# Patient Record
Sex: Female | Born: 1955 | Race: White | Hispanic: No | Marital: Married | Smoking: Never smoker
Health system: Southern US, Community
[De-identification: ages and names within clinical notes are randomized; demographics above are authoritative.]

## PROBLEM LIST (undated history)

## (undated) DIAGNOSIS — F32A Depression, unspecified: Secondary | ICD-10-CM

## (undated) DIAGNOSIS — G588 Other specified mononeuropathies: Secondary | ICD-10-CM

## (undated) DIAGNOSIS — N9489 Other specified conditions associated with female genital organs and menstrual cycle: Secondary | ICD-10-CM

## (undated) DIAGNOSIS — M6289 Other specified disorders of muscle: Secondary | ICD-10-CM

## (undated) DIAGNOSIS — N814 Uterovaginal prolapse, unspecified: Secondary | ICD-10-CM

## (undated) DIAGNOSIS — G43909 Migraine, unspecified, not intractable, without status migrainosus: Secondary | ICD-10-CM

## (undated) DIAGNOSIS — F329 Major depressive disorder, single episode, unspecified: Secondary | ICD-10-CM

## (undated) DIAGNOSIS — K5790 Diverticulosis of intestine, part unspecified, without perforation or abscess without bleeding: Secondary | ICD-10-CM

## (undated) DIAGNOSIS — K219 Gastro-esophageal reflux disease without esophagitis: Secondary | ICD-10-CM

## (undated) DIAGNOSIS — N824 Other female intestinal-genital tract fistulae: Secondary | ICD-10-CM

## (undated) HISTORY — PX: HERNIA REPAIR: SHX51

## (undated) HISTORY — DX: Uterovaginal prolapse, unspecified: N81.4

## (undated) HISTORY — DX: Major depressive disorder, single episode, unspecified: F32.9

## (undated) HISTORY — PX: TONSILLECTOMY: SUR1361

## (undated) HISTORY — DX: Other specified conditions associated with female genital organs and menstrual cycle: N94.89

## (undated) HISTORY — DX: Other specified mononeuropathies: G58.8

## (undated) HISTORY — DX: Other specified disorders of muscle: M62.89

## (undated) HISTORY — DX: Other female intestinal-genital tract fistulae: N82.4

## (undated) HISTORY — DX: Migraine, unspecified, not intractable, without status migrainosus: G43.909

## (undated) HISTORY — PX: BACK SURGERY: SHX140

## (undated) HISTORY — DX: Depression, unspecified: F32.A

## (undated) HISTORY — PX: ANTERIOR AND POSTERIOR VAGINAL REPAIR: SUR5

## (undated) HISTORY — PX: APPENDECTOMY: SHX54

## (undated) HISTORY — PX: SIGMOID RESECTION / RECTOPEXY: SUR1294

## (undated) HISTORY — PX: OTHER SURGICAL HISTORY: SHX169

## (undated) HISTORY — DX: Diverticulosis of intestine, part unspecified, without perforation or abscess without bleeding: K57.90

## (undated) HISTORY — PX: TUBAL LIGATION: SHX77

## (undated) HISTORY — DX: Gastro-esophageal reflux disease without esophagitis: K21.9

---

## 1995-04-22 HISTORY — PX: BREAST ENHANCEMENT SURGERY: SHX7

## 1996-04-21 HISTORY — PX: ABDOMINAL HYSTERECTOMY: SHX81

## 1997-07-24 ENCOUNTER — Emergency Department (HOSPITAL_COMMUNITY): Admission: EM | Admit: 1997-07-24 | Discharge: 1997-07-24 | Payer: Self-pay | Admitting: Emergency Medicine

## 1998-03-01 ENCOUNTER — Inpatient Hospital Stay (HOSPITAL_COMMUNITY): Admission: RE | Admit: 1998-03-01 | Discharge: 1998-03-03 | Payer: Self-pay | Admitting: Obstetrics and Gynecology

## 1999-01-07 ENCOUNTER — Ambulatory Visit (HOSPITAL_COMMUNITY): Admission: RE | Admit: 1999-01-07 | Discharge: 1999-01-07 | Payer: Self-pay | Admitting: Internal Medicine

## 1999-03-09 ENCOUNTER — Emergency Department (HOSPITAL_COMMUNITY): Admission: EM | Admit: 1999-03-09 | Discharge: 1999-03-09 | Payer: Self-pay

## 1999-04-08 ENCOUNTER — Encounter: Payer: Self-pay | Admitting: Emergency Medicine

## 1999-04-08 ENCOUNTER — Emergency Department (HOSPITAL_COMMUNITY): Admission: EM | Admit: 1999-04-08 | Discharge: 1999-04-08 | Payer: Self-pay | Admitting: Emergency Medicine

## 1999-04-29 ENCOUNTER — Ambulatory Visit (HOSPITAL_COMMUNITY): Admission: RE | Admit: 1999-04-29 | Discharge: 1999-04-29 | Payer: Self-pay | Admitting: Obstetrics and Gynecology

## 1999-04-29 ENCOUNTER — Encounter: Payer: Self-pay | Admitting: Obstetrics and Gynecology

## 2001-12-14 ENCOUNTER — Other Ambulatory Visit: Admission: RE | Admit: 2001-12-14 | Discharge: 2001-12-14 | Payer: Self-pay | Admitting: Obstetrics and Gynecology

## 2002-07-05 ENCOUNTER — Encounter: Payer: Self-pay | Admitting: *Deleted

## 2002-07-05 ENCOUNTER — Ambulatory Visit (HOSPITAL_COMMUNITY): Admission: RE | Admit: 2002-07-05 | Discharge: 2002-07-05 | Payer: Self-pay | Admitting: *Deleted

## 2003-06-13 ENCOUNTER — Encounter: Admission: RE | Admit: 2003-06-13 | Discharge: 2003-06-13 | Payer: Self-pay | Admitting: Obstetrics and Gynecology

## 2004-01-17 ENCOUNTER — Ambulatory Visit (HOSPITAL_COMMUNITY): Admission: RE | Admit: 2004-01-17 | Discharge: 2004-01-17 | Payer: Self-pay | Admitting: *Deleted

## 2004-03-19 ENCOUNTER — Other Ambulatory Visit: Admission: RE | Admit: 2004-03-19 | Discharge: 2004-03-19 | Payer: Self-pay | Admitting: Obstetrics and Gynecology

## 2004-11-04 ENCOUNTER — Inpatient Hospital Stay (HOSPITAL_COMMUNITY): Admission: AD | Admit: 2004-11-04 | Discharge: 2004-11-04 | Payer: Self-pay | Admitting: Obstetrics and Gynecology

## 2005-01-16 ENCOUNTER — Other Ambulatory Visit: Admission: RE | Admit: 2005-01-16 | Discharge: 2005-01-16 | Payer: Self-pay | Admitting: Obstetrics and Gynecology

## 2005-02-06 ENCOUNTER — Ambulatory Visit: Payer: Self-pay | Admitting: Internal Medicine

## 2005-02-10 ENCOUNTER — Encounter: Payer: Self-pay | Admitting: Internal Medicine

## 2005-02-10 ENCOUNTER — Ambulatory Visit (HOSPITAL_COMMUNITY): Admission: RE | Admit: 2005-02-10 | Discharge: 2005-02-10 | Payer: Self-pay | Admitting: Internal Medicine

## 2005-02-13 ENCOUNTER — Ambulatory Visit: Payer: Self-pay | Admitting: Internal Medicine

## 2005-02-18 ENCOUNTER — Ambulatory Visit: Payer: Self-pay | Admitting: Internal Medicine

## 2005-04-04 ENCOUNTER — Inpatient Hospital Stay (HOSPITAL_COMMUNITY): Admission: RE | Admit: 2005-04-04 | Discharge: 2005-04-08 | Payer: Self-pay | Admitting: Surgery

## 2005-04-04 ENCOUNTER — Encounter (INDEPENDENT_AMBULATORY_CARE_PROVIDER_SITE_OTHER): Payer: Self-pay | Admitting: *Deleted

## 2005-04-04 ENCOUNTER — Encounter (INDEPENDENT_AMBULATORY_CARE_PROVIDER_SITE_OTHER): Payer: Self-pay | Admitting: Specialist

## 2005-04-08 ENCOUNTER — Encounter (INDEPENDENT_AMBULATORY_CARE_PROVIDER_SITE_OTHER): Payer: Self-pay | Admitting: *Deleted

## 2005-05-26 ENCOUNTER — Encounter (INDEPENDENT_AMBULATORY_CARE_PROVIDER_SITE_OTHER): Payer: Self-pay | Admitting: *Deleted

## 2005-05-26 ENCOUNTER — Encounter: Admission: RE | Admit: 2005-05-26 | Discharge: 2005-05-26 | Payer: Self-pay | Admitting: Surgery

## 2005-06-11 ENCOUNTER — Encounter (INDEPENDENT_AMBULATORY_CARE_PROVIDER_SITE_OTHER): Payer: Self-pay | Admitting: *Deleted

## 2005-06-11 ENCOUNTER — Ambulatory Visit (HOSPITAL_COMMUNITY): Admission: RE | Admit: 2005-06-11 | Discharge: 2005-06-11 | Payer: Self-pay | Admitting: Surgery

## 2005-06-19 ENCOUNTER — Ambulatory Visit: Payer: Self-pay | Admitting: Internal Medicine

## 2006-08-05 ENCOUNTER — Ambulatory Visit: Payer: Self-pay | Admitting: Internal Medicine

## 2007-02-23 ENCOUNTER — Ambulatory Visit: Payer: Self-pay | Admitting: Internal Medicine

## 2007-02-23 ENCOUNTER — Encounter: Admission: RE | Admit: 2007-02-23 | Discharge: 2007-02-23 | Payer: Self-pay | Admitting: Internal Medicine

## 2007-02-23 LAB — CONVERTED CEMR LAB
Basophils Relative: 1 % (ref 0.0–1.0)
HCT: 37.5 % (ref 36.0–46.0)
Hemoglobin: 13.2 g/dL (ref 12.0–15.0)
Lymphocytes Relative: 23.1 % (ref 12.0–46.0)
MCHC: 35.3 g/dL (ref 30.0–36.0)
MCV: 84.8 fL (ref 78.0–100.0)
Monocytes Absolute: 0.5 10*3/uL (ref 0.2–0.7)
Monocytes Relative: 9.9 % (ref 3.0–11.0)
Platelets: 174 10*3/uL (ref 150–400)
RBC: 4.42 M/uL (ref 3.87–5.11)
RDW: 12.1 % (ref 11.5–14.6)
WBC: 5.3 10*3/uL (ref 4.5–10.5)

## 2007-02-25 ENCOUNTER — Ambulatory Visit: Payer: Self-pay | Admitting: Internal Medicine

## 2007-02-25 ENCOUNTER — Encounter: Payer: Self-pay | Admitting: Internal Medicine

## 2007-03-02 ENCOUNTER — Encounter (INDEPENDENT_AMBULATORY_CARE_PROVIDER_SITE_OTHER): Payer: Self-pay | Admitting: *Deleted

## 2007-03-02 ENCOUNTER — Encounter: Admission: RE | Admit: 2007-03-02 | Discharge: 2007-03-02 | Payer: Self-pay | Admitting: Surgery

## 2007-04-29 ENCOUNTER — Ambulatory Visit: Payer: Self-pay | Admitting: Internal Medicine

## 2007-06-14 DIAGNOSIS — G43909 Migraine, unspecified, not intractable, without status migrainosus: Secondary | ICD-10-CM | POA: Insufficient documentation

## 2007-06-14 DIAGNOSIS — K219 Gastro-esophageal reflux disease without esophagitis: Secondary | ICD-10-CM | POA: Insufficient documentation

## 2007-09-07 ENCOUNTER — Telehealth: Payer: Self-pay | Admitting: Internal Medicine

## 2007-09-14 ENCOUNTER — Telehealth: Payer: Self-pay | Admitting: Internal Medicine

## 2007-09-17 ENCOUNTER — Telehealth: Payer: Self-pay | Admitting: Internal Medicine

## 2007-11-16 ENCOUNTER — Telehealth: Payer: Self-pay | Admitting: Internal Medicine

## 2007-11-22 ENCOUNTER — Ambulatory Visit: Payer: Self-pay | Admitting: Internal Medicine

## 2007-12-15 ENCOUNTER — Emergency Department (HOSPITAL_COMMUNITY): Admission: EM | Admit: 2007-12-15 | Discharge: 2007-12-15 | Payer: Self-pay | Admitting: Emergency Medicine

## 2007-12-15 IMAGING — CR DG SACRUM/COCCYX 2+V
3 series · 3 of 3 positions shown · non-contrast
Comparison: None

CLINICAL DATA: Fell off horse.

SACRUM AND COCCYX - 2+ VIEW

[t sacrum a.p.]
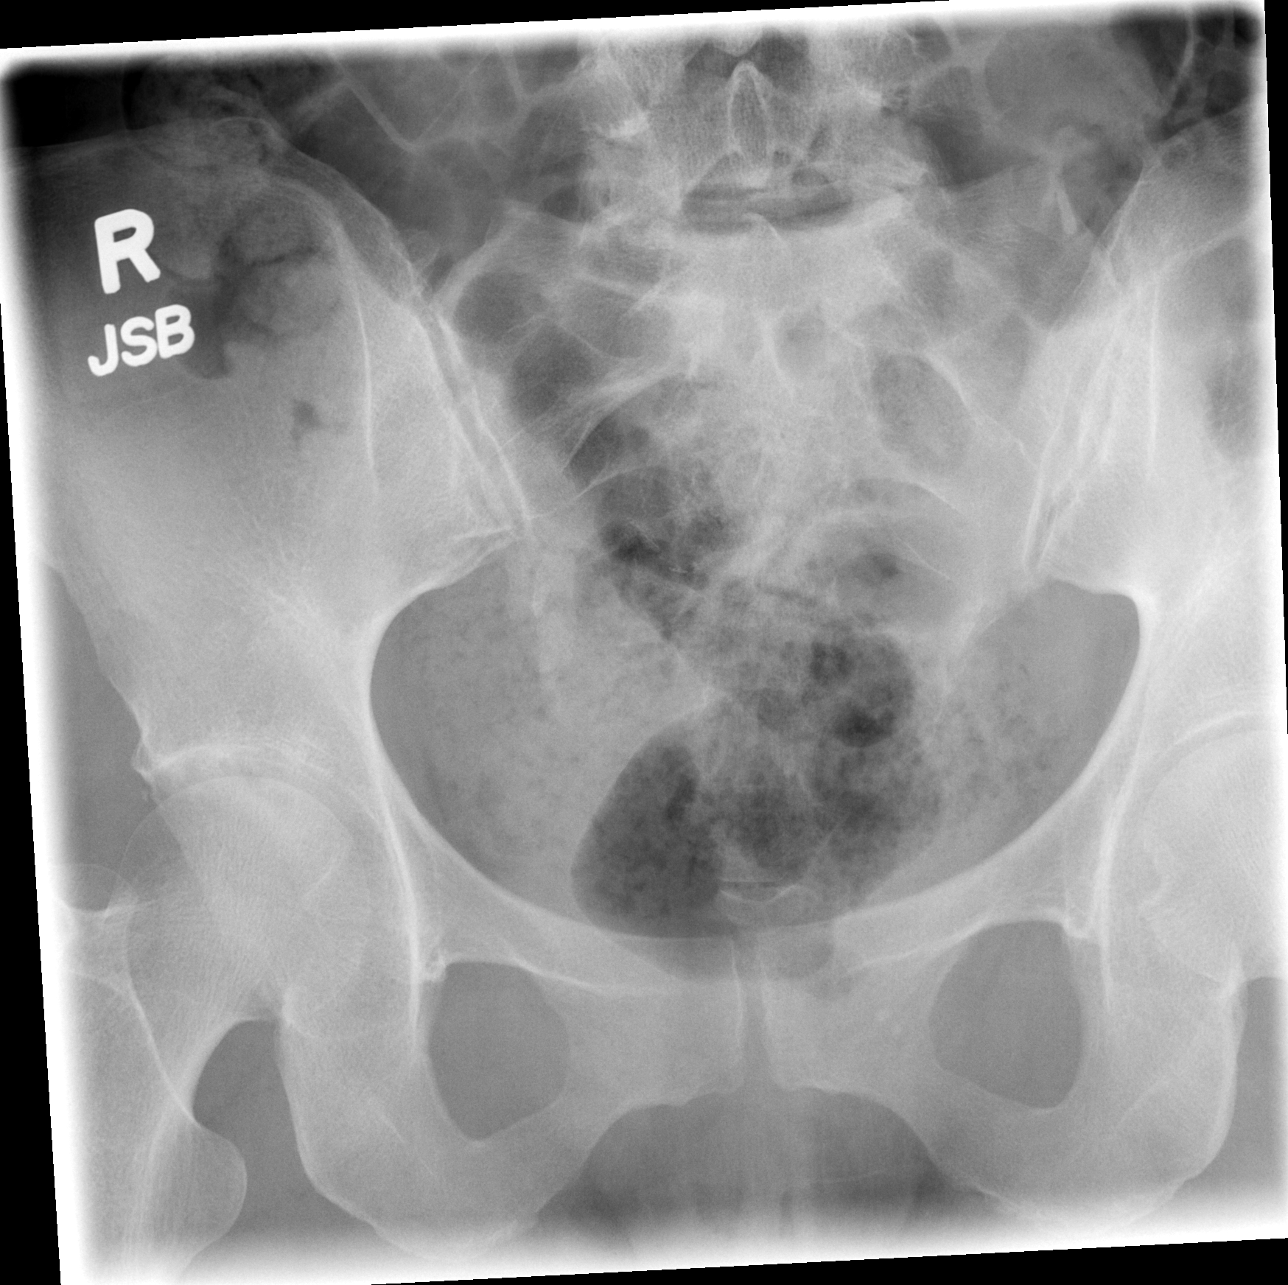

[t coccyx a.p.]
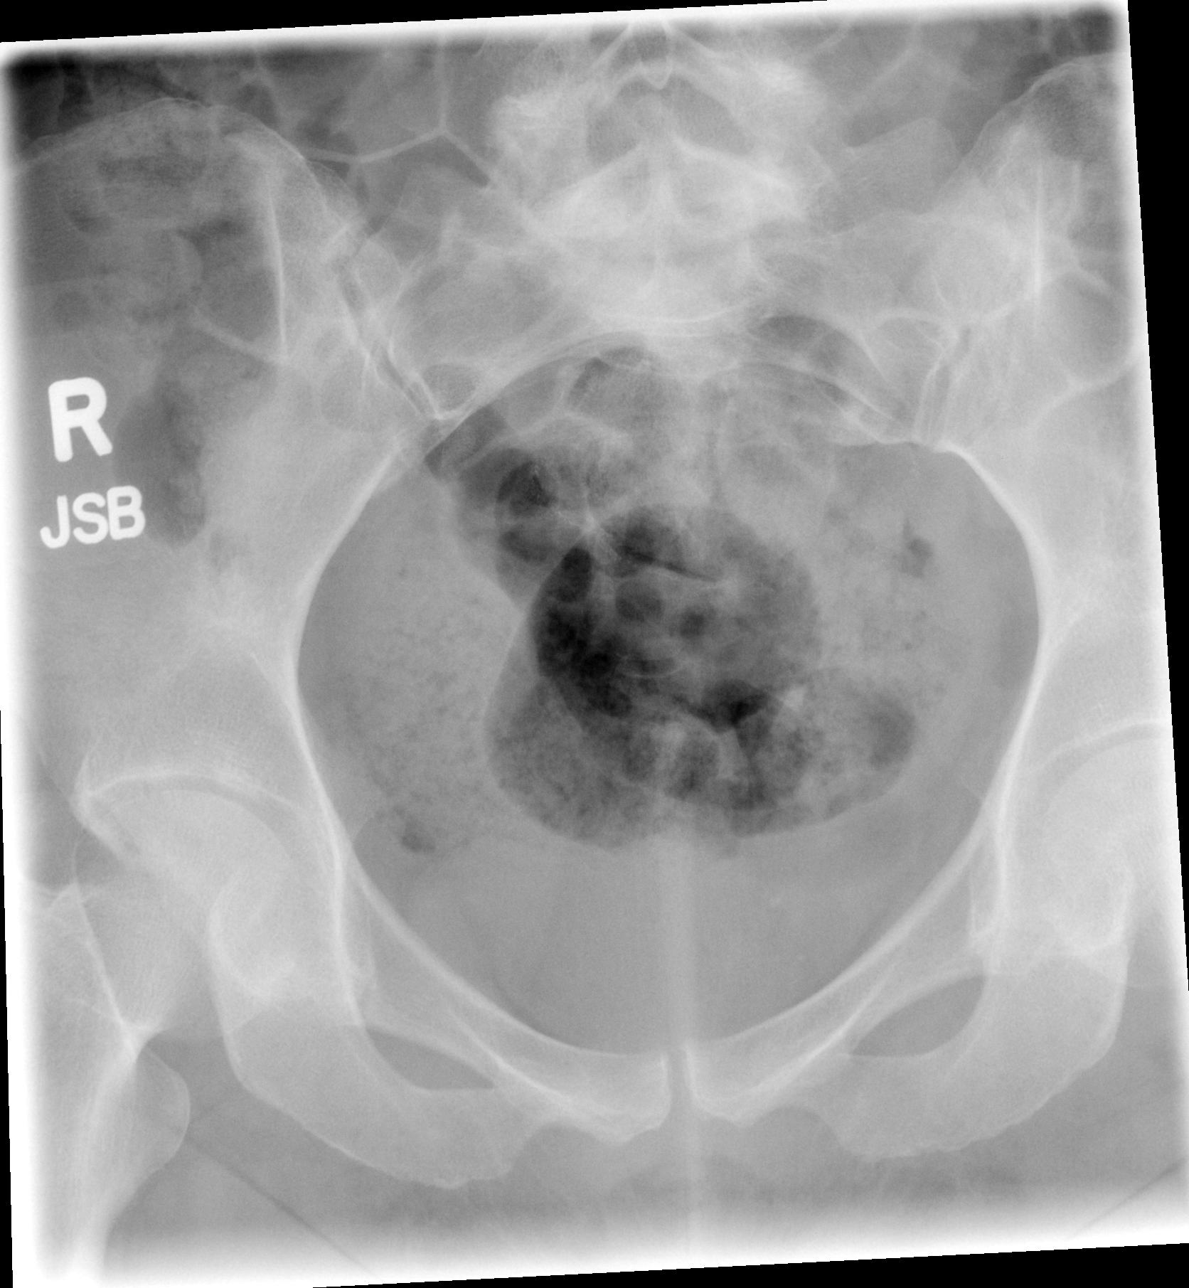

[t coccyx lat]
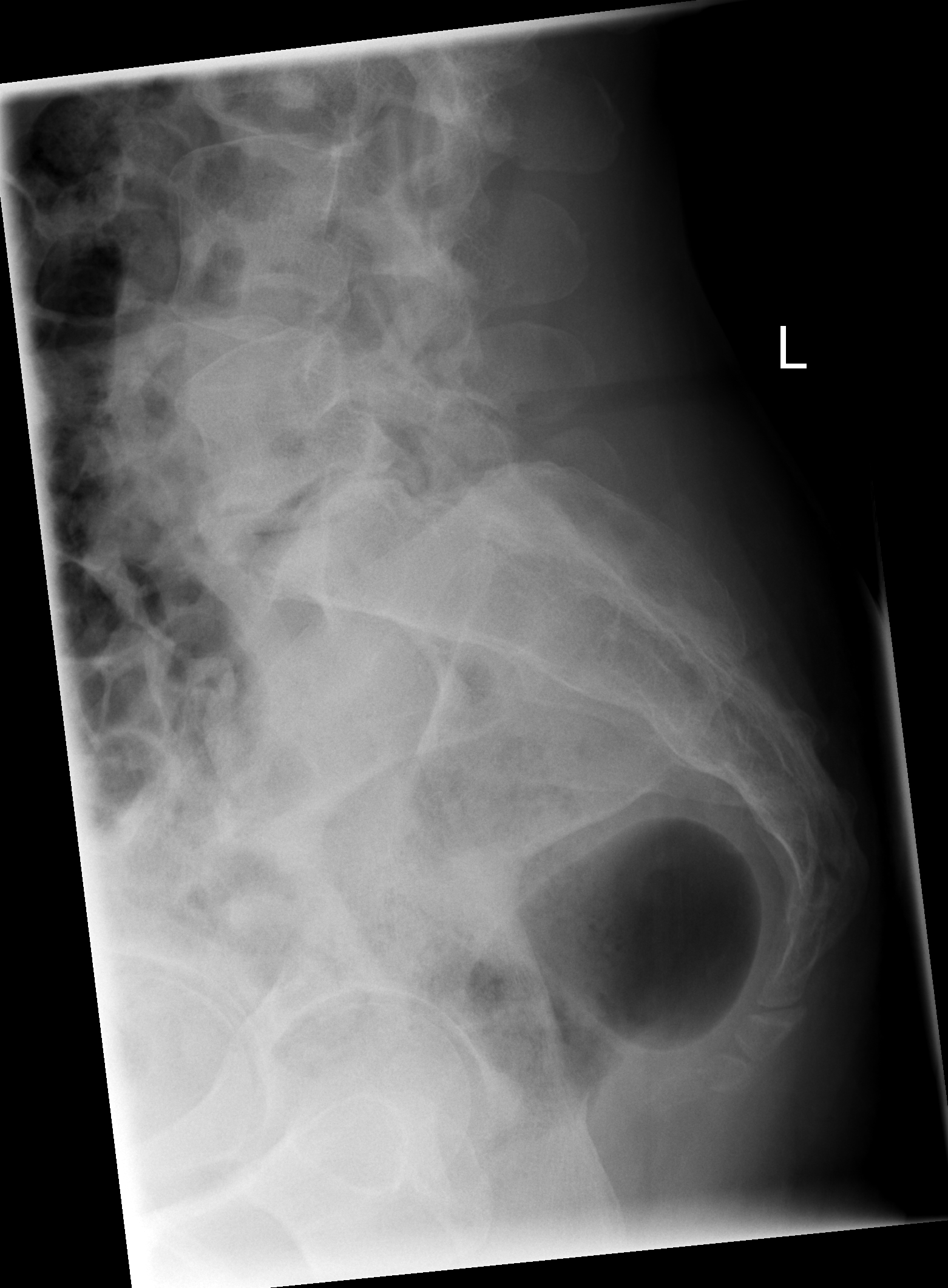

[3 of 3 positions shown; findings below may reference images not displayed]

FINDINGS: SI joints are symmetric.  No acute bony abnormality.  No
evidence of sacral or coccygeal fracture.
IMPRESSION: No acute bony abnormality.

## 2007-12-20 ENCOUNTER — Telehealth: Payer: Self-pay | Admitting: Internal Medicine

## 2007-12-21 ENCOUNTER — Telehealth: Payer: Self-pay | Admitting: Internal Medicine

## 2007-12-22 ENCOUNTER — Ambulatory Visit: Payer: Self-pay | Admitting: Family Medicine

## 2007-12-22 DIAGNOSIS — IMO0002 Reserved for concepts with insufficient information to code with codable children: Secondary | ICD-10-CM | POA: Insufficient documentation

## 2007-12-22 DIAGNOSIS — M533 Sacrococcygeal disorders, not elsewhere classified: Secondary | ICD-10-CM | POA: Insufficient documentation

## 2007-12-22 DIAGNOSIS — K623 Rectal prolapse: Secondary | ICD-10-CM | POA: Insufficient documentation

## 2007-12-23 ENCOUNTER — Telehealth: Payer: Self-pay | Admitting: Family Medicine

## 2007-12-23 ENCOUNTER — Encounter: Payer: Self-pay | Admitting: Family Medicine

## 2008-01-04 ENCOUNTER — Telehealth: Payer: Self-pay | Admitting: Internal Medicine

## 2008-01-18 ENCOUNTER — Ambulatory Visit: Payer: Self-pay | Admitting: Family Medicine

## 2008-01-20 LAB — CONVERTED CEMR LAB
ALT: 19 units/L (ref 0–35)
AST: 24 units/L (ref 0–37)
Albumin: 3.8 g/dL (ref 3.5–5.2)
Alkaline Phosphatase: 52 units/L (ref 39–117)
Bilirubin, Direct: 0.1 mg/dL (ref 0.0–0.3)
Cholesterol: 138 mg/dL (ref 0–200)
Creatinine, Ser: 0.7 mg/dL (ref 0.4–1.2)
GFR calc Af Amer: 113 mL/min
HDL: 43.5 mg/dL (ref >39.0)
Potassium: 4.5 meq/L (ref 3.5–5.1)
T3, Free: 3.1 pg/mL (ref 2.3–4.2)
TSH: 0.21 microintl units/mL — ABNORMAL LOW (ref 0.35–5.50)
Total Bilirubin: 0.7 mg/dL (ref 0.3–1.2)
Total Protein: 6.5 g/dL (ref 6.0–8.3)
Triglycerides: 68 mg/dL (ref 0–149)

## 2008-01-24 ENCOUNTER — Ambulatory Visit: Payer: Self-pay | Admitting: Family Medicine

## 2008-01-24 DIAGNOSIS — F438 Other reactions to severe stress: Secondary | ICD-10-CM | POA: Insufficient documentation

## 2008-01-24 DIAGNOSIS — N824 Other female intestinal-genital tract fistulae: Secondary | ICD-10-CM | POA: Insufficient documentation

## 2008-01-24 DIAGNOSIS — K5730 Diverticulosis of large intestine without perforation or abscess without bleeding: Secondary | ICD-10-CM | POA: Insufficient documentation

## 2008-01-28 ENCOUNTER — Telehealth: Payer: Self-pay | Admitting: Family Medicine

## 2008-03-01 ENCOUNTER — Telehealth: Payer: Self-pay | Admitting: Family Medicine

## 2008-06-29 ENCOUNTER — Telehealth: Payer: Self-pay | Admitting: Internal Medicine

## 2008-07-03 ENCOUNTER — Telehealth: Payer: Self-pay | Admitting: Family Medicine

## 2009-03-20 ENCOUNTER — Telehealth: Payer: Self-pay | Admitting: Internal Medicine

## 2009-05-28 ENCOUNTER — Telehealth: Payer: Self-pay | Admitting: Internal Medicine

## 2009-05-29 DIAGNOSIS — R5383 Other fatigue: Secondary | ICD-10-CM

## 2009-05-29 DIAGNOSIS — R5381 Other malaise: Secondary | ICD-10-CM | POA: Insufficient documentation

## 2009-05-30 ENCOUNTER — Telehealth: Payer: Self-pay | Admitting: Internal Medicine

## 2009-05-30 ENCOUNTER — Encounter: Payer: Self-pay | Admitting: Internal Medicine

## 2009-06-12 ENCOUNTER — Telehealth: Payer: Self-pay | Admitting: Internal Medicine

## 2009-06-21 ENCOUNTER — Telehealth: Payer: Self-pay | Admitting: Internal Medicine

## 2009-08-09 ENCOUNTER — Telehealth: Payer: Self-pay | Admitting: Internal Medicine

## 2010-05-11 ENCOUNTER — Encounter: Payer: Self-pay | Admitting: Internal Medicine

## 2010-05-12 ENCOUNTER — Encounter: Payer: Self-pay | Admitting: Internal Medicine

## 2010-05-21 NOTE — Op Note (Signed)
Summary: Cystoscopy (colovaginal fistula)  NAME:  Margaret Richardson, Margaret Richardson             ACCOUNT NO.:  [id number]   MEDICAL RECORD NO.:  [id number]          PATIENT TYPE:  INP   LOCATION:  1416                         FACILITY:  [facility]   PHYSICIAN:  Bertram Millard. Dahlstedt, M.D.DATE OF BIRTH:  December 14, 1955   DATE OF PROCEDURE:  DATE OF DISCHARGE:                                 OPERATIVE REPORT   PREOPERATIVE DIAGNOSIS:  Sigmoid diverticulitis with colovaginal fistula.   POSTOPERATIVE DIAGNOSIS:  Sigmoid diverticulitis with colovaginal fistula.   PROCEDURE:  Cystoscopy, bilateral ureteral catheter placement.   SURGEON:  Bertram Millard. Dahlstedt, M.D.   ANESTHESIA:  General endotracheal.   COMPLICATIONS:  None.   BRIEF HISTORY:  This nice 55 year old female presents for sigmoid colectomy  by Dr. Corliss Skains. She has a colorectal fistula from this diverticulitis.  Ureteral catheter placement is requested before the surgical procedure.   DESCRIPTION OF PROCEDURE:  This lady was given preoperative IV antibiotics  and taken to the operating room where general anesthetic was administered.  She was placed in the low dorsal lithotomy position. Genitalia and perineum  were prepped and draped. Cystoscopy was performed. The bladder appeared  normal. Both ureteral orifices were cannulated with a 6-French end-hole  catheter. They were passed proximally into the renal pelves bilaterally. The  bladder was drained, the scope removed and a Foley catheter placed. The  stents were placed to the catheter and hooked to a  Goldberg catheter  Adapter. The patient tolerated the procedure well.   Dr. Corliss Skains commenced with the open surgical part of the procedure at this  point.      Bertram Millard. Dahlstedt, M.D.  Electronically Signed     SMD/MEDQ  D:  04/04/2005  T:  04/07/2005  Job:  [id number]

## 2010-05-21 NOTE — Progress Notes (Signed)
Summary: Triage  Phone Note Call from Patient Call back at [phone]   Caller: Patient Call For: Dr. Juanda Chance Reason for Call: Talk to Nurse Summary of Call: Pt. said her lab orders need to be faxed to [facility] [phone]. Her Ins. will not pay for [facility]. Pt. also has some questions about the lab orders. Initial call taken by: Karna Christmas,  May 30, 2009 10:45 AM  Follow-up for Phone Call        DR.BRODIE--Pt. wants some labs added to her orders-Estrogen level and Cortisol level. is this O.K.? Follow-up by: Laureen Ochs LPN,  May 30, 2009 10:57 AM  Additional Follow-up for Phone Call Additional follow up Details #1::        It is OK Additional Follow-up by: Hart Carwin MD,  May 30, 2009 12:57 PM     Appended Document: Triage Lab orders faxed to above #, pt. aware.

## 2010-05-21 NOTE — Progress Notes (Signed)
Summary: Lab Results  Phone Note Call from Patient Call back at Home Phone [phone]   Call For: DR Shevaun Lovan Reason for Call: Lab or Test Results Initial call taken by: Leanor Kail [facility],  June 12, 2009 11:35 AM  Follow-up for Phone Call        Pt. is aware labs are WNL, no new orders from Dr.Kenya Shiraishi.  Pt. to keep scheduled office visit. Pt. instructed to call back as needed.   Follow-up by: Laureen Ochs LPN,  June 12, 2009 11:54 AM

## 2010-05-21 NOTE — Discharge Summary (Signed)
Summary: Colvaginal Fistula  NAME:  Margaret Richardson, Margaret Richardson             ACCOUNT NO.:  [id number]   MEDICAL RECORD NO.:  [id number]          PATIENT TYPE:  INP   LOCATION:  1416                         FACILITY:  [facility]   PHYSICIAN:  Wilmon Arms. Corliss Skains, M.D. 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   PROCEDURE PERFORMED:  Repair of colovaginal fistula with sigmoid colectomy  and primary anastomosis on April 04, 2005 by Dr. Corliss Skains.   BRIEF HISTORY:  The patient is a 55 year old female who presented with lower  abdominal pain and signs and symptoms of a colovaginal fistula. Apparently,  the patient had a bout of diverticulitis earlier this year. The patient has  had a previous hysterectomy and apparently the diverticulitis fistula is to  her vaginal cuff. The patient has been worked up prior to my evaluation by  Dr. Juanda Chance at gastroenterology, Dr. Henderson Cloud, GYN and Dr. Retta Diones in  urology. She was referred for a surgical consultation after discovery of the  colovaginal fistula.   HOSPITAL COURSE:  The patient underwent a 2 day bowel prep prior to surgery.  She was admitted to the hospital on April 04, 2005 where she underwent an  exploratory laparotomy, take down of the colovaginal fistula as well as a  sigmoid colectomy with primary anastomosis. The procedure went well. The  patient has progressed rather rapidly postoperatively. She regained bowel  function on postoperative day 2 and has been advanced to a regular diet.  Today she is being discharged home on a regular diet. She is taking Vicodin  p.r.n. for pain. Her wound appears to be healing well. There is one tiny  spot in the mid portion that was draining some fluid. I  removed 2 staples  and there was no purulence under this area. She will treat this with dry  gauze. Her pain is well controlled with the Vicodin.   DISCHARGE INSTRUCTIONS:  The patient is to followup on Friday at 12:40 p.m.  in my office for possible staple removal. She may eat a regular diet. She  has been given Vicodin p.r.n. for pain. I encouraged her to add a fiber  supplement to help firm up her bowel movements. She may ambulate and go up  and down stairs but she is to refrain from any strenuous activity.      Wilmon Arms. Tsuei, M.D.  Electronically Signed     MKT/MEDQ  D:  04/08/2005  T:  04/10/2005  Job:  [id number]   cc:   Lina Sar, M.D. [facility]  [address]  [place]  [place] [address]   Guy Sandifer. Henderson Cloud, M.D.  Fax: [phone]   Bertram Millard. Dahlstedt, M.D.  Fax: [phone]

## 2010-05-21 NOTE — Progress Notes (Signed)
Summary: Discuss her meds  Phone Note Call from Patient Call back at Home Phone [phone]   Call For: DR Juanda Chance Reason for Call: Talk to Nurse Summary of Call: Wants to discuss her meds with nurse before her appoinment on 07-06-09 Initial call taken by: Leanor Kail [facility],  May 28, 2009 4:20 PM  Follow-up for Phone Call        Patient called to state that she is extremely depressed and does not feel well. She called our office back in November 2010 to request something cheaper than Lexapro. She was advised at that time to find out what medication her insurance prefers and let us know so we can accomodate her request. She never called [place] back, but instead stopped taking her Lexapro. She states that she is very lethargic now and feels like she "needs a bunch of labs drawn soon." She denies any new physical symptoms or pain. She does tell me that she has been trying hospice counseling etc. She states that we told her we would help her with her Depression medications. Any suggestions? Follow-up by: Hortense Ramal CMA Duncan Dull),  May 28, 2009 5:25 PM  Additional Follow-up for Phone Call Additional follow up Details #1::        please start her on Elavil 25 mg by mouth at bedtime x 2 weeks then to 50 mg by mouth once daily, it is cheap. Draw CBC,C-Met, TSH, TIBC, Fe Additional Follow-up by: Hart Carwin MD,  May 28, 2009 6:09 PM  New Problems: FATIGUE (ICD-780.79)   Additional Follow-up for Phone Call Additional follow up Details #2::    Left message for patient to call back. Hortense Ramal CMA Duncan Dull)  May 29, 2009 9:15 AM   Patient advised that Dr Juanda Chance would like to start her on Elavil and would like her to have her labs drawn just to insure that there are no other physical problems causing her fatigue. Patient [facility]. She states that she will have to call her insurance to make sure that they will cover having her labs drawn at our facility.  Labs have been entered in IDX. Patient has been advised to call back immediately should she not start feeling better on the Elavil. Patient verbalizes understanding and agrees to do so. Hortense Ramal CMA Duncan Dull)  May 29, 2009 9:34 AM   New Problems: FATIGUE (ICD-780.79) New/Updated Medications: * ELAVIL 25 MG TABLET Take 1 tablet at bedtime x 2 weeks then increase to 2 tablets (50 mg) by mouth at bedtime Prescriptions: ELAVIL 25 MG TABLET Take 1 tablet at bedtime x 2 weeks then increase to 2 tablets (50 mg) by mouth at bedtime  #60 x 3   Entered by:   Hortense Ramal CMA (AAMA)   Authorized by:   Hart Carwin MD   Signed by:   Hortense Ramal CMA (AAMA) on 05/29/2009   Method used:   Faxed to ...       [facility] [address] (retail)       [address]       [place], [place]  [address]       Ph: [phone]       Fax: [phone]   RxID:   [id number]   Appended Document: Discuss her meds    Clinical Lists Changes  Medications: Rx of ELAVIL 25 MG TABLET Take 1 tablet at bedtime x  2 weeks then increase to 2 tablets (50 mg) by mouth at bedtime;  #60 x 3;  Signed;  Entered by: Hortense Ramal CMA (AAMA);  Authorized by: Hart Carwin MD;  Method used: Re-faxed to [facility] [address], [place], [place], [place]  [address], Ph: [phone], Fax: [phone]    Prescriptions: ELAVIL 25 MG TABLET Take 1 tablet at bedtime x 2 weeks then increase to 2 tablets (50 mg) by mouth at bedtime  #60 x 3   Entered by:   Hortense Ramal CMA (AAMA)   Authorized by:   Hart Carwin MD   Signed by:   Hortense Ramal CMA (AAMA) on 05/29/2009   Method used:   Re-Faxed to ...       [facility] [address] (retail)       [address]       [place], [place]  [address]       Ph: [phone]       Fax: [phone]   RxID:   [id number]

## 2010-05-21 NOTE — Op Note (Signed)
Summary: Flexible Sigmoidoscopy (Dr Corliss Skains)  NAME:  Margaret Richardson, Margaret Richardson             ACCOUNT NO.:  [id number]   MEDICAL RECORD NO.:  [id number]          PATIENT TYPE:  AMB   LOCATION:  ENDO                         FACILITY:  [facility]   PHYSICIAN:  Wilmon Arms. Corliss Skains, M.D. DATE OF BIRTH:  1955-06-01   DATE OF PROCEDURE:  06/11/2005  DATE OF DISCHARGE:                                 OPERATIVE REPORT   PREOP DIAGNOSIS:  Lower gastrointestinal bleed status post sigmoid  colectomy.   POSTOPERATIVE DIAGNOSIS:  Lower gastrointestinal bleed status post sigmoid  colectomy.   PROCEDURE PERFORMED:  Flexible sigmoidoscopy.   SURGEON:  Wilmon Arms. Tsuei, M.D.   ANESTHETIC:  Conscious sedation with 75 mcg of Fentanyl and 6 mg of Versed.   INDICATIONS FOR PROCEDURE:  The patient is a 55 year old female who recently  was diagnosed with a colovaginal fistula secondary to sigmoid  diverticulitis. She underwent a sigmoid resection approximately 2 months  ago. Since that time she has regained full bowel function, but has streaks  of blood with her stool; and she comes in today for a sigmoidoscopy to check  her anastomosis.   DESCRIPTION OF PROCEDURE:  The patient was seen in the endoscopy unit. After  obtaining informed consent, she was positioned on her left side. She was  given sedation. Digital rectal examination was not performed which showed no  masses. There was no gross blood. The scope was introduced and slowly  advanced to approximately 40 cm. The lower rectum and the distal sigmoid  colon that remains showed some mild irritation. The anastomosis was seen and  was widely patent. There was one area of granulation tissue posteriorly. The  scope was advanced through the anastomosis and advanced up to 40 cm. Some  small sigmoid diverticula were noted. These did not look inflamed and were  not bleeding. The prep was adequate. The scope was then slowly withdrawn. At  the anastomosis a hot biopsy forceps  was used to cauterize the area of  granulation tissue. This area was then irrigated. No further bleeding was  noted. The scope was then withdrawn. The patient tolerated the procedure  well. She was awakened and brought to the recovery room.      Wilmon Arms. Tsuei, M.D.  Electronically Signed     MKT/MEDQ  D:  06/11/2005  T:  06/12/2005  Job:  [id number]

## 2010-05-21 NOTE — Progress Notes (Signed)
Summary: Medication refill  Phone Note Call from Patient Call back at Home Phone [phone] Call back at [phone]   Caller: Patient Call For: Dr. Juanda Chance Reason for Call: Talk to Nurse Summary of Call: [facility] Initial call taken by: Karna Christmas,  August 09, 2009 11:57 AM  Follow-up for Phone Call        Dr Juanda Chance- This patient was getting ambien prescription from [place] and is now requesting new prescription...Marland Kitchenhowever, looks like she requested the prescription from her PCP, Dr Ermalene Searing and was told that Dr Ermalene Searing    New/Updated Medications: AMBIEN 10 MG  TABS (ZOLPIDEM TARTRATE) Take 1 tab by mouth at bedtime as needed. MUST HAVE OFFICE VISIT FOR FURTHER REFILLS! Prescriptions: AMBIEN 10 MG  TABS (ZOLPIDEM TARTRATE) Take 1 tab by mouth at bedtime as needed. MUST HAVE OFFICE VISIT FOR FURTHER REFILLS!  #30 x 0   Entered by:   Hortense Ramal CMA (AAMA)   Authorized by:   Hart Carwin MD   Signed by:   Hortense Ramal CMA (AAMA) on 08/09/2009   Method used:   Printed then faxed to ...       [facility] [address] (retail)       [address]       [place], [place]  [address]       Ph: [phone]       Fax: [phone]   RxID:   [id number]

## 2010-05-21 NOTE — Progress Notes (Signed)
Summary: Lab results  Phone Note Call from Patient Call back at Home Phone [phone]   Caller: Patient Call For: Dr. Juanda Chance Reason for Call: Lab or Test Results Summary of Call: Pt is calling regarding her lab results Initial call taken by: Karna Christmas,  June 21, 2009 12:18 PM  Follow-up for Phone Call        her insurance won't pay for the lab work that was  done at [facility] on 05-30-09.  her insurance denied it due to depression.  I advised her that the diagnosis code that was ordered was 780. 79  and that I didn't see depression on her problem list.  I have sent her a copy of the lab order that was placed in [facility] and a copy of her chart summary with her current meds and problems.  She will call [facility] to find out how they submitted this to her insurance companu. Follow-up by: Darcey Nora RN, CGRN,  June 21, 2009 2:13 PM

## 2010-09-03 NOTE — Assessment & Plan Note (Signed)
[facility]                         GASTROENTEROLOGY OFFICE NOTE   JIMMIE, DATTILIO                    MRN:          [id number]  DATE:02/23/2007                            DOB:          1955-07-03    Ms. Szafran is a 55 year old white female who is an acute work-in  because of lower abdominal pain which started yesterday after work.  It  was initially localized to the right lower quadrant.  She also has had  some low back pain last week or so.  We saw her in the past for acute  diverticulitis complicated by chronic colovaginal fistula which  ultimately required surgical resection by Dr. Corliss Skains in December, 2006.  She had a sigmoid colectomy.  She did very well until this summer when  her daughter was diagnosed with stage III breast cancer just 3 days  after delivering a baby.  Ms. Frumkin has been quite upset and visited  her daughter in [place] several times.  She has been having a hard time  dealing with her daughter's illness.  As a result of it she has  developed increasing gastroesophageal reflux, constipation, lower  abdominal pain.  She has lost about 7 pounds.  Ms. Landsberg has a history  of depression.  She used to be on Effexor up to 200 or 300 mg daily at  the time that her ex-husband was having bypass surgery, but she recently  has not been on an antidepressant.  She continues to work as a Engineer, civil (consulting) in  [employer].   MEDICATIONS:  1. Carafate 1 gram p.o. b.i.d.  2. Levsin sublingually 0.125 mg p.r.n.  3. Omeprazole over-the-counter 20 mg p.o. b.i.d.   PHYSICAL EXAM:  Blood pressure 108/70, pulse 60 and weight 121.8 pounds.  She alert, oriented, visibly upset.  She cried several times during our  interview.  LUNGS:  Clear to auscultation.  COR:  With normal S1, normal S2.  ABDOMEN:  Soft, relaxed but diffusely tender, more so in the left lower  and middle quadrant but no rebound, no fullness, no tympany.  No CVA  tenderness.  RECTAL EXAM:  Reveals normal rectal tone.  Stool was soft and Hemoccult  negative.   IMPRESSION:  72. A 55 year old white female with exacerbation of lower abdominal      pain and gastroesophageal reflux disease most likely related to      recent stress relating to this illness of her daughter.  She also      has a history of underlying diverticular disease of the colon and      likely irritable bowel syndrome.  She is status post sigmoid      colectomy for a colovaginal fistula two years ago.  There were no      signs of recurrent infections, no fever, mass effect or vaginal      discharge but patient is quite concerned about the possibility of      recurrent diverticulitis.  2. Gastroesophageal reflux most likely exacerbated by stress.   PLAN:  1. CT scan of the abdomen and pelvis to look for any inflammatory  changes in the pelvis related to her colon.  2. MiraLax 17 grams, to take 1/2 dose p.r.n. constipation.  3. Refill for Ambien 5 mg and Prilosec 20 mg b.i.d.  4. Vicodin 5/500, 1/2 tablet q.6 hours p.r.n. severe abdominal pain.  5. A new prescription for Levbid 0.375 mg p.o. b.i.d., dispense 60.  6. Depending on the results of the CT scan, consider flexible      sigmoidoscopy to look into the left colon.  At this point I do not      believe we are dealing with diverticulitis.  7. A CBC today.  8. I am also starting her on Lexapro 5 mg daily on a trial basis.     Hedwig Morton. Juanda Chance, MD  Electronically Signed    DMB/MedQ  DD: 02/23/2007  DT: 02/23/2007  Job #: [id number]

## 2010-09-03 NOTE — Assessment & Plan Note (Signed)
[facility]                         GASTROENTEROLOGY OFFICE NOTE   Margaret Richardson, TOBIA                    MRN:          [id number]  DATE:04/29/2007                            DOB:          1956/01/15    Ms. Berkery is a 55 year old white female with diverticulosis, status  post diverticulitis, colovaginal fistula, status post colon sigmoid  resection in December 2006 and flare-up of pelvic pain in November 2008.  She has been seen by Dr. Corliss Skains for possible recurrence of the  colovaginal fistula as well as by Dr. Henderson Cloud.  The CT scan did not show  any clear communication between the vagina and the colon.  This was also  confirmed on flexible sigmoidoscopy which was done on February 25, 2007  showing diverticulosis and functioning sigmoid anastomosis.  She has  done quite well in the last 2 months, finishing the course of Flagyl and  Cipro.  She has not worked and has avoided riding her horse which seemed  to have aggravated the pelvic pain.  She still has some dyspareunia and  some pain with prolonged standing.  She still has constipation but has  been able to reduce her pain medication tramadol from 50 mg twice a day  to 25 mg twice a day.  She also has reduced her Vicodin to only less  than once a week.   PHYSICAL EXAMINATION:  Blood pressure 108/68, pulse 88, weight 119.8  pounds.  She was alert, oriented, in no distress.  ABDOMEN:  Abdominal exam today showed soft abdomen which was mildly  diffusely tender but not distended with normoactive bowel sounds, tattoo  in the right lower quadrant, mild tenderness left lower quadrant.  RECTAL:  Exam not repeated.   IMPRESSION:  28. A 55 year old white female with resolving pelvic inflammation      without evidence of recurrent colovaginal fistula.  2. Known diverticulosis of the left colon, status post sigmoid      resection in December 2006.  3. Positive family history of esophageal cancer in her  father.  4. Gastroesophageal reflux disease.  5. Depression.   PLAN:  Renew of all her medications.  She needs to continue her MiraLax.  I advised her to try to gain about 2 more pounds to her usual weight of  over 120 pounds.  I will see her again in 3 months.     Hedwig Morton. Juanda Chance, MD  Electronically Signed    DMB/MedQ  DD: 04/29/2007  DT: 04/29/2007  Job #: [id number]   cc:   Guy Sandifer. Henderson Cloud, M.D.  Wilmon Arms. Tsuei, M.D.

## 2010-09-06 NOTE — Op Note (Signed)
NAMERONNE, SAVOIA             ACCOUNT NO.:  [id number]   MEDICAL RECORD NO.:  [id number]           PATIENT TYPE:   LOCATION:                                 FACILITY:   PHYSICIAN:  Wilmon Arms. Corliss Skains, M.D.      DATE OF BIRTH:   DATE OF PROCEDURE:  04/04/2005  DATE OF DISCHARGE:                                 OPERATIVE REPORT   PREOPERATIVE DIAGNOSIS:  Colovaginal fistula.   POSTOPERATIVE DIAGNOSIS:  Colovaginal fistula.   PROCEDURE PERFORMED:  1.  Repair of colovaginal fistula.  2.  Sigmoid colectomy.  3.  Appendectomy.   SURGEON:  Dr. Corliss Skains   ASSISTANT:  Dr. Lurene Shadow   ANESTHESIA:  General endotracheal.   INDICATION:  The patient is a 55 year old female in excellent health, who is  status post hysterectomy.  Earlier this year, she began having low  suprapubic abdominal pain.  Shortly thereafter, she began having abnormal  vaginal discharge and was passing gas from her vagina.  She was thoroughly  evaluated by Dr. Retta Diones in urology, who performed a CT scan as well as  cystoscopy.  This showed some inflammation in the posterior bladder.  She  also had a colonoscopy which showed severe diverticulosis with a narrowed  lumen in the mid sigmoid region.  CT scan confirmed the finding of  colovaginal fistula.  The patient is referred for surgical evaluation.  She  received a preoperative bowel preparation.   DESCRIPTION OF PROCEDURE:  The patient was brought to the operating room and  placed in a supine position on the operating room table.  After an adequate  level of general endotracheal anesthesia was obtained, the patient's legs  were placed in yellowfin stirrups in lithotomy position.  Dr. Retta Diones  performed cystoscopy with stent placement.  See the separate dictated  report.   After Dr. Retta Diones completed his portion of the procedure, the patient's  legs were lowered but remained in yellow fin stirrups.  Her abdomen was  prepped with Betadine and draped in a  sterile fashion.  A time-out was taken  to confirm the proper patient and proper procedure.  A lower midline  incision was made.  Dissection was carried down through the subcutaneous  tissues using Bovie cautery.  The fascia was opened, and the peritoneum was  entered.  There was no purulent fluid in the abdomen.  There were minimal  inflammatory adhesions in the left lower quadrant.  We were immediately able  to visualize the area of diverticulosis with chronic scarring.  This was  densely adherent to the vaginal cuff.  The patient was rotated into  Trendelenburg position.  Her small bowel was packed away with sponges.  The  Balfour retractor was used for lateral abdominal wall retraction.  The  adhesions holding the sigmoid colon to the lateral abdominal wall were taken  down along the white line of Toldt.  The colovaginal fistula was taken down  with blunt dissection.  The vaginal fistula opening was oversewn with a  figure-of-eight 2-0 silk suture.  The sigmoid colon at this point was fairly  mobile.  The  area of inflammation and thickening was relatively short,  approximately 12 cm.  The colon appeared normal proximally and distally.  The colon was then divided proximally and distally with GIA stapler.  The  mesentery was divided between [place] clamps and ligated with 2-0 silk ties.  Our anastomosis was then created by creating 2 small colotomies on the tinea  coli of each limb.  The GIA stapler was inserted into each opening and fired  to create a common channel.  The common opening was then closed with a TA60  stapler.  The mesenteric defect was closed with a 2-0 silk suture.  A couple  of bleeding areas at the edge of the staple line were oversewn with 3-0 silk  sutures.  The anastomosis was palpated and was felt to be widely patent.  We  then explored the remained of the abdomen.  There were no other  abnormalities noted.  The right colon appeared to be very mobile.  The  appendix  appeared long but normal in size and caliber.  We elected to do an  elective appendectomy.  The mesoappendix was ligated with 2-0 silk ties.  Another load of the GIA stapler was used to divide the base of the appendix.  This was re-placed down in the right lower quadrant.  Both fallopian tubes  and ovaries were visually inspected and noted to be normal.  The pelvis was  then thoroughly irrigated with saline.  No bleeding was noted.  All sponge  counts were correct at this point.  We pulled down the omentum and placed it  over the bowel in the lower abdomen.  The fascia was closed with a running  #1 PDS suture.  Skin staples were used to close the skin.  All sponge,  needle, and instrument counts were correct.      Wilmon Arms. Tsuei, M.D.  Electronically Signed     MKT/MEDQ  D:  04/04/2005  T:  04/07/2005  Job:  [id number]   cc:   Bertram Millard. Dahlstedt, M.D.  Fax: [phone]   Lina Sar, M.D. [facility]  [address]  [place]  [place] [address]

## 2010-09-06 NOTE — Assessment & Plan Note (Signed)
[facility]                         GASTROENTEROLOGY OFFICE NOTE   Margaret Richardson, Margaret Richardson                    MRN:          [id number]  DATE:08/05/2006                            DOB:          03-03-1956    Ms. Withington is a 55 year old white female who has new onset of  heartburn, indigestion, and chest pain which started in last 3 months.  We saw her last year with colovesical fistula, acute diverticulitis  necessitating sigmoid resection and repair of colovaginal fistula by Dr.  Corliss Skains on 04/04/2005.  She did well postoperatively and really has no  lower GI symptoms at this time.  There is no obvious reason for her  reflux symptoms.  She did change a job but her work schedule is regular,  from 7 a.m. to 3 p.m.  She has the same eating habits.  Her weight  remains stable and her stress level is somewhat less.  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  She does not smoke,  does not drink alcohol, and has only one cup of coffee a day.   MEDICATIONS:  1. Multivitamin and calcium supplement.  2. Nexium 40 mg, she has taken up to 4 a day from her mother's supply.   Approximately at [facility] Room with chest pain and was  admitted overnight to rule out MI.  Her cardiac CT scan, enzymes and EKG  were all normal and she was discharged to be followed by her internist.   PHYSICAL EXAMINATION:  Blood pressure 110/78, pulse 72, and weight 128  pounds.  She was in no distress.  NECK:  Supple, without adenopathy.  LUNG FIELDS:  Clear to auscultation.  CARDIAC:  Normal S1, S2.  ABDOMEN:  Soft, nontender.  I could not elicit any tenderness at  costochondral junction or of the sternum.  Her voice was normal.   IMPRESSION:  A 55 year old white female with new onset of what sounds  like either esophageal spasm, esophagitis, or gastroesophageal reflux.  She has a positive family history of esophageal cancer in her father who  had squamous cell carcinoma.  There is nothing to suggest esophageal  stricture and she has no preexisting history of gastroesophageal reflux.   PLAN:  1. Nexium 40 mg p.o. b.i.d..  2. Levsin sublingual .125 mg b.i.d. and p.r.n. esophageal spasm.  3. Carafate 1 gram twice a day.  4. Refill for Ambien 10 mg, dispense 30, 1 p.o. daily, transmitted      electronically to [facility].  5. I also wanted to do an upper endoscopy but the patient prefers to      wait and try medications first.     Hedwig Morton. Juanda Chance, MD  Electronically Signed    DMB/MedQ  DD: 08/05/2006  DT: 08/05/2006  Job #: [id number]   cc:   Corwin Levins, MD

## 2010-09-06 NOTE — Op Note (Signed)
Margaret Richardson, Margaret Richardson             ACCOUNT NO.:  [id number]   MEDICAL RECORD NO.:  [id number]          PATIENT TYPE:  AMB   LOCATION:  ENDO                         FACILITY:  [facility]   PHYSICIAN:  Wilmon Arms. Corliss Skains, M.D. DATE OF BIRTH:  1956-02-06   DATE OF PROCEDURE:  06/11/2005  DATE OF DISCHARGE:                                 OPERATIVE REPORT   PREOP DIAGNOSIS:  Lower gastrointestinal bleed status post sigmoid  colectomy.   POSTOPERATIVE DIAGNOSIS:  Lower gastrointestinal bleed status post sigmoid  colectomy.   PROCEDURE PERFORMED:  Flexible sigmoidoscopy.   SURGEON:  Wilmon Arms. Tsuei, M.D.   ANESTHETIC:  Conscious sedation with 75 mcg of Fentanyl and 6 mg of Versed.   INDICATIONS FOR PROCEDURE:  The patient is a 55 year old female who recently  was diagnosed with a colovaginal fistula secondary to sigmoid  diverticulitis. She underwent a sigmoid resection approximately 2 months  ago. Since that time she has regained full bowel function, but has streaks  of blood with her stool; and she comes in today for a sigmoidoscopy to check  her anastomosis.   DESCRIPTION OF PROCEDURE:  The patient was seen in the endoscopy unit. After  obtaining informed consent, she was positioned on her left side. She was  given sedation. Digital rectal examination was not performed which showed no  masses. There was no gross blood. The scope was introduced and slowly  advanced to approximately 40 cm. The lower rectum and the distal sigmoid  colon that remains showed some mild irritation. The anastomosis was seen and  was widely patent. There was one area of granulation tissue posteriorly. The  scope was advanced through the anastomosis and advanced up to 40 cm. Some  small sigmoid diverticula were noted. These did not look inflamed and were  not bleeding. The prep was adequate. The scope was then slowly withdrawn. At  the anastomosis a hot biopsy forceps was used to cauterize the area of  granulation tissue. This area was then irrigated. No further bleeding was  noted. The scope was then withdrawn. The patient tolerated the procedure  well. She was awakened and brought to the recovery room.      Wilmon Arms. Tsuei, M.D.  Electronically Signed     MKT/MEDQ  D:  06/11/2005  T:  06/12/2005  Job:  [id number]

## 2010-09-06 NOTE — Op Note (Signed)
NAMESTEPHANIEANN, [name]             ACCOUNT NO.:  [id number]   MEDICAL RECORD NO.:  [id number]          PATIENT TYPE:  INP   LOCATION:  1416                         FACILITY:  [facility]   PHYSICIAN:  Bertram Millard. Dahlstedt, M.D.DATE OF BIRTH:  February 08, 1956   DATE OF PROCEDURE:  DATE OF DISCHARGE:                                 OPERATIVE REPORT   PREOPERATIVE DIAGNOSIS:  Sigmoid diverticulitis with colovaginal fistula.   POSTOPERATIVE DIAGNOSIS:  Sigmoid diverticulitis with colovaginal fistula.   PROCEDURE:  Cystoscopy, bilateral ureteral catheter placement.   SURGEON:  Bertram Millard. Dahlstedt, M.D.   ANESTHESIA:  General endotracheal.   COMPLICATIONS:  None.   BRIEF HISTORY:  This nice 55 year old female presents for sigmoid colectomy  by Dr. Corliss Skains. She has a colorectal fistula from this diverticulitis.  Ureteral catheter placement is requested before the surgical procedure.   DESCRIPTION OF PROCEDURE:  This lady was given preoperative IV antibiotics  and taken to the operating room where general anesthetic was administered.  She was placed in the low dorsal lithotomy position. Genitalia and perineum  were prepped and draped. Cystoscopy was performed. The bladder appeared  normal. Both ureteral orifices were cannulated with a 6-French end-hole  catheter. They were passed proximally into the renal pelves bilaterally. The  bladder was drained, the scope removed and a Foley catheter placed. The  stents were placed to the catheter and hooked to a  Goldberg catheter  Adapter. The patient tolerated the procedure well.   Dr. Corliss Skains commenced with the open surgical part of the procedure at this  point.      Bertram Millard. Dahlstedt, M.D.  Electronically Signed     SMD/MEDQ  D:  04/04/2005  T:  04/07/2005  Job:  [id number]

## 2010-09-06 NOTE — Discharge Summary (Signed)
Margaret Richardson, REYBURN             ACCOUNT NO.:  [id number]   MEDICAL RECORD NO.:  [id number]          PATIENT TYPE:  INP   LOCATION:  1416                         [facility]ACILITY:  [facility]   PHYSICIAN:  Wilmon Arms. Corliss Skains, M.D. 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   PROCEDURE PERFORMED:  Repair of colovaginal fistula with sigmoid colectomy  and primary anastomosis on April 04, 2005 by Dr. Corliss Skains.   BRIEF HISTORY:  The patient is a 55 year old female who presented with lower  abdominal pain and signs and symptoms of a colovaginal fistula. Apparently,  the patient had a bout of diverticulitis earlier this year. The patient has  had a previous hysterectomy and apparently the diverticulitis fistula is to  her vaginal cuff. The patient has been worked up prior to my evaluation by  Dr. Juanda Chance at gastroenterology, Dr. Henderson Cloud, GYN and Dr. Retta Diones in  urology. She was referred for a surgical consultation after discovery of the  colovaginal fistula.   HOSPITAL COURSE:  The patient underwent a 2 day bowel prep prior to surgery.  She was admitted to the hospital on April 04, 2005 where she underwent an  exploratory laparotomy, take down of the colovaginal fistula as well as a  sigmoid colectomy with primary anastomosis. The procedure went well. The  patient has progressed rather rapidly postoperatively. She regained bowel  function on postoperative day 2 and has been advanced to a regular diet.  Today she is being discharged home on a regular diet. She is taking Vicodin  p.r.n. for pain. Her wound appears to be healing well. There is one tiny  spot in the mid portion that was draining some fluid. I removed 2 staples  and there  was no purulence under this area. She will treat this with dry  gauze. Her pain is well controlled with the Vicodin.   DISCHARGE INSTRUCTIONS:  The patient is to followup on Friday at 12:40 p.m.  in my office for possible staple removal. She may eat a regular diet. She  has been given Vicodin p.r.n. for pain. I encouraged her to add a fiber  supplement to help firm up her bowel movements. She may ambulate and go up  and down stairs but she is to refrain from any strenuous activity.      Wilmon Arms. Tsuei, M.D.  Electronically Signed     MKT/MEDQ  D:  04/08/2005  T:  04/10/2005  Job:  [id number]   cc:   Lina Sar, M.D. [facility]  [address]  [place]  [place] [address]   Guy Sandifer. Henderson Cloud, M.D.  Fax: [phone]   Bertram Millard. Dahlstedt, M.D.  Fax: [phone]

## 2011-10-13 ENCOUNTER — Telehealth: Payer: Self-pay | Admitting: Internal Medicine

## 2011-10-13 NOTE — Telephone Encounter (Signed)
If Aurea Graff gives me add-on spots in the office I can see her .Please talk to Lissa Hoard

## 2011-10-13 NOTE — Telephone Encounter (Signed)
Left a message for patient to call me. 

## 2011-10-13 NOTE — Telephone Encounter (Signed)
Patient calling to report rectal pain and bleeding x 1 month. She has tried OTC hemorrhoid suppositories. The bleeding is getting worse. States she has blood that drips in toilet and on tissue when wipes. Scheduled patient with Mike Gip, PA on 10/14/11 at 8:30 AM.

## 2011-10-13 NOTE — Telephone Encounter (Signed)
Rescheduled patient to 10/21/11 at 9:00 AM. Patient aware.

## 2011-10-14 ENCOUNTER — Ambulatory Visit: Payer: Self-pay | Admitting: Physician Assistant

## 2011-10-15 ENCOUNTER — Encounter: Payer: Self-pay | Admitting: *Deleted

## 2011-10-21 ENCOUNTER — Encounter: Payer: Self-pay | Admitting: Internal Medicine

## 2011-10-21 ENCOUNTER — Ambulatory Visit (INDEPENDENT_AMBULATORY_CARE_PROVIDER_SITE_OTHER): Payer: PRIVATE HEALTH INSURANCE | Admitting: Internal Medicine

## 2011-10-21 VITALS — BP 118/64 | HR 88 | Ht 63.0 in | Wt 147.0 lb

## 2011-10-21 DIAGNOSIS — K648 Other hemorrhoids: Secondary | ICD-10-CM

## 2011-10-21 DIAGNOSIS — K5732 Diverticulitis of large intestine without perforation or abscess without bleeding: Secondary | ICD-10-CM

## 2011-10-21 DIAGNOSIS — K6289 Other specified diseases of anus and rectum: Secondary | ICD-10-CM

## 2011-10-21 MED ORDER — HYDROCORTISONE ACETATE 25 MG RE SUPP: 25.0000 mg | Freq: Two times a day (BID) | RECTAL | Status: AC

## 2011-10-21 MED ORDER — LIDOCAINE (ANORECTAL) 5 % EX CREA: 1.0000 "application " | TOPICAL_CREAM | CUTANEOUS | Status: DC | PRN

## 2011-10-21 NOTE — Patient Instructions (Addendum)
We have sent the following medications to your pharmacy for you to pick up at your convenience: Anusol suppositories 2 times daily We have given you samples of the following medication to take: Recticare. You may apply this to your rectum as needed. If this works well, ask Engineer, production for it. It is over the counter. You have been scheduled for an appointment with Ruben Gottron, PT @ [facility] on Thursday 10/30/11 @ 10:00 am. They are located at [address] [employer]. If you need to reschedule your appointment, please contact [facility] directly at [phone]. CC: Dr Gaye Alken, Ruben Gottron, PT

## 2011-10-21 NOTE — Progress Notes (Signed)
Margaret Richardson May 08, 1955 MRN [id number]   History of Present Illness:  This is a 56 year old white female with chronic rectal pain which is on the inside of the rectum and sometimes radiates to her thigh area. She works as a Engineer, civil (consulting) in [employer], [employer]. We saw her for similar problems several years ago. She has a history of a vaginal hysterectomy and anterior and posterior repair by Dr.Tomblin in the past and then underwent a sigmoid resection for diverticular abscess and colovaginal fistula in November 2006 by Dr.Tsuei. Her last colonoscopy showed a patent anastomosis. A CT scan of the abdomen in November 2008 showed sigmoid thickening. She denies constipation but has occasional rectal bleeding. The pain is worse when she is standing and is partially relieved by laying down.    Past Medical History  Diagnosis Date  . Migraine   . Uterine prolapse   . Depression   . GERD (gastroesophageal reflux disease)   . Colovaginal fistula   . Diverticulosis    Past Surgical History  Procedure Date  . Sigmoid resection / rectopexy   . Colovaginal fistula repair   . Abdominal hysterectomy 1998  . Breast enhancement surgery 1997  . Appendectomy   . Hernia repair   . Tubal ligation   . Back surgery     lumbar disc    reports that she has never smoked. She has never used smokeless tobacco. She reports that she does not drink alcohol or use illicit drugs. family history includes Diabetes in her maternal grandmother.  There is no history of Colon cancer. No Known Allergies      Review of Systems:Negative for dyspepsia heartburn chest pain or shortness of breath  The remainder of the 10 point ROS is negative except as outlined in H&P   Physical Exam: General appearance  Well developed, in no distress. Eyes- non icteric. HEENT nontraumatic, normocephalic. Mouth no lesions, tongue papillated, no cheilosis. Neck supple without adenopathy, thyroid not enlarged, no carotid bruits, no  JVD. Lungs Clear to auscultation bilaterally. Cor normal S1, normal S2, regular rhythm, no murmur,  quiet precordium. Abdomen:Soft relaxed abdomen with normal active bowel sounds. No distention. No tenderness. Liver edge at costal margin.  Rectal:Normal perianal area. No apparent prolapse of the rectal tissue. Rectal sphincter tone is normal. On anoscopic exam, there is edematous nodular mucosa consistent with internal hemorrhoids versus discended rectal tissue which does not prolapse all the way through the anal canal. There is no rectal bleeding and there is no stool. Extremities no pedal edema. Skin no lesions. Neurological alert and oriented x 3. Psychological normal mood and affect.  Assessment and Plan:  Problem #1 Chronic rectal pain due to pelvic floor dysfunction and most likely a recurrent rectocele or possibly due to symptomatic internal hemorrhoids. We will have her evaluated in the incontinence clinic to see whether she would benefit from rectopexy. We will use Anusol-HC suppositories twice a day and Recticare to use as a topical anesthetic. I advised her to stay off her feet several times a day to take the pressure off the rectum. She would like to wait with doing another colonoscopy. We will consider a CT scan of the pelvis depending on the report from incontinence clinic. She has MiraLax at home to use when necessary.  Problem #2 History of diverticulitis with perforation and colovaginal fistula repair in November 2006.  Problem #3 Depression since the death of her daughter from breast cancer 3 years ago. Her grandson is with his father.  10/21/2011 Lina Sar

## 2012-07-06 ENCOUNTER — Telehealth: Payer: Self-pay | Admitting: Internal Medicine

## 2012-07-07 NOTE — Telephone Encounter (Signed)
Left a message for patient to call me. 

## 2012-07-07 NOTE — Telephone Encounter (Signed)
Spoke with patient and she has decided to see her GYN Dr. Henderson Cloud. She states she has an OV with him tomorrow. She requested I send him her lost OV note. Sent via [facility].

## 2012-09-08 ENCOUNTER — Encounter (HOSPITAL_COMMUNITY): Payer: Self-pay | Admitting: *Deleted

## 2012-09-08 ENCOUNTER — Inpatient Hospital Stay (HOSPITAL_COMMUNITY): Payer: PRIVATE HEALTH INSURANCE

## 2012-09-08 ENCOUNTER — Inpatient Hospital Stay (HOSPITAL_COMMUNITY)
Admission: AD | Admit: 2012-09-08 | Discharge: 2012-09-10 | DRG: 392 | Disposition: A | Discharge status: Home / Self Care | Payer: PRIVATE HEALTH INSURANCE | Source: Ambulatory Visit | Attending: Internal Medicine | Admitting: Internal Medicine

## 2012-09-08 DIAGNOSIS — R0789 Other chest pain: Secondary | ICD-10-CM

## 2012-09-08 DIAGNOSIS — F3289 Other specified depressive episodes: Secondary | ICD-10-CM | POA: Diagnosis present

## 2012-09-08 DIAGNOSIS — K219 Gastro-esophageal reflux disease without esophagitis: Principal | ICD-10-CM | POA: Diagnosis present

## 2012-09-08 DIAGNOSIS — F411 Generalized anxiety disorder: Secondary | ICD-10-CM | POA: Diagnosis present

## 2012-09-08 DIAGNOSIS — D649 Anemia, unspecified: Secondary | ICD-10-CM | POA: Diagnosis present

## 2012-09-08 DIAGNOSIS — R1013 Epigastric pain: Secondary | ICD-10-CM | POA: Diagnosis present

## 2012-09-08 DIAGNOSIS — R748 Abnormal levels of other serum enzymes: Secondary | ICD-10-CM | POA: Diagnosis present

## 2012-09-08 DIAGNOSIS — R079 Chest pain, unspecified: Secondary | ICD-10-CM | POA: Diagnosis present

## 2012-09-08 DIAGNOSIS — R0689 Other abnormalities of breathing: Secondary | ICD-10-CM

## 2012-09-08 DIAGNOSIS — K5900 Constipation, unspecified: Secondary | ICD-10-CM | POA: Diagnosis present

## 2012-09-08 DIAGNOSIS — Z79899 Other long term (current) drug therapy: Secondary | ICD-10-CM

## 2012-09-08 DIAGNOSIS — K859 Acute pancreatitis without necrosis or infection, unspecified: Secondary | ICD-10-CM

## 2012-09-08 DIAGNOSIS — F329 Major depressive disorder, single episode, unspecified: Secondary | ICD-10-CM | POA: Diagnosis present

## 2012-09-08 LAB — COMPREHENSIVE METABOLIC PANEL
ALT: 11 U/L (ref 0–35)
AST: 22 U/L (ref 0–37)
CO2: 26 mEq/L (ref 19–32)
Calcium: 9.6 mg/dL (ref 8.4–10.5)
Creatinine, Ser: 0.72 mg/dL (ref 0.50–1.10)
GFR calc non Af Amer: >90 mL/min (ref >90)
Sodium: 139 mEq/L (ref 135–145)
Total Bilirubin: 0.3 mg/dL (ref 0.3–1.2)
Total Protein: 7.4 g/dL (ref 6.0–8.3)

## 2012-09-08 LAB — LIPASE, BLOOD: Lipase: 67 U/L — ABNORMAL HIGH (ref 11–59)

## 2012-09-08 LAB — CBC WITH DIFFERENTIAL/PLATELET
Lymphocytes Relative: 37 % (ref 12–46)
Lymphs Abs: 2.1 10*3/uL (ref 0.7–4.0)
Neutro Abs: 2.8 10*3/uL (ref 1.7–7.7)
Neutrophils Relative %: 49 % (ref 43–77)
Platelets: 309 10*3/uL (ref 150–400)
RBC: 4.35 MIL/uL (ref 3.87–5.11)
WBC: 5.7 10*3/uL (ref 4.0–10.5)

## 2012-09-08 LAB — PRO B NATRIURETIC PEPTIDE: Pro B Natriuretic peptide (BNP): 23.3 pg/mL (ref 0–125)

## 2012-09-08 LAB — CREATININE, SERUM
Creatinine, Ser: 0.69 mg/dL (ref 0.50–1.10)
GFR calc non Af Amer: >90 mL/min (ref >90)

## 2012-09-08 LAB — TROPONIN I: Troponin I: <0.3 ng/mL (ref <0.30)

## 2012-09-08 MED ORDER — GI COCKTAIL ~~LOC~~
30.0000 mL | Freq: Once | ORAL | Status: AC
  Administered 2012-09-08: 30 mL via ORAL
  Filled 2012-09-08: qty 30

## 2012-09-08 MED ORDER — KETOROLAC TROMETHAMINE 30 MG/ML IJ SOLN
30.0000 mg | Freq: Once | INTRAMUSCULAR | Status: AC
  Administered 2012-09-08: 30 mg via INTRAVENOUS
  Filled 2012-09-08: qty 1

## 2012-09-08 MED ORDER — IOHEXOL 350 MG/ML SOLN
100.0000 mL | Freq: Once | INTRAVENOUS | Status: AC | PRN
  Administered 2012-09-08: 100 mL via INTRAVENOUS

## 2012-09-08 MED ORDER — LACTATED RINGERS IV SOLN
INTRAVENOUS | Status: DC
  Administered 2012-09-08: 16:00:00 via INTRAVENOUS
  Administered 2012-09-08: 1000 mL via INTRAVENOUS

## 2012-09-08 MED ORDER — HYDROMORPHONE HCL PF 1 MG/ML IJ SOLN
1.0000 mg | Freq: Once | INTRAMUSCULAR | Status: AC
  Administered 2012-09-08: 1 mg via INTRAVENOUS
  Filled 2012-09-08: qty 1

## 2012-09-08 MED ORDER — ONDANSETRON HCL 4 MG/2ML IJ SOLN
4.0000 mg | Freq: Once | INTRAMUSCULAR | Status: AC
  Administered 2012-09-08: 4 mg via INTRAVENOUS
  Filled 2012-09-08: qty 2

## 2012-09-08 MED ORDER — ASPIRIN 81 MG PO CHEW
324.0000 mg | CHEWABLE_TABLET | Freq: Once | ORAL | Status: AC
  Administered 2012-09-08: 324 mg via ORAL
  Filled 2012-09-08: qty 4

## 2012-09-08 NOTE — ED Provider Notes (Signed)
History     CSN: [id number]  Arrival date & time 09/08/12  1244   First MD Initiated Contact with Patient 09/08/12 1736      Chief Complaint  Patient presents with  . Chest Pain  . Shortness of Breath    (Consider location/radiation/quality/duration/timing/severity/associated sxs/prior treatment) Patient is a 57 y.o. female presenting with chest pain and shortness of breath. The history is provided by the patient.  Chest Pain Associated symptoms: shortness of breath   Shortness of Breath Associated symptoms: chest pain   She is 3 weeks status post repair of cystocele and rectocele and has been having chest pressure or and shortness of breath for the last 5 days. Symptoms have been constant. They are worse with exertion and not affected by body position. There is no associated nausea, vomiting, diaphoresis. Pain is not affected by eating. She went to [facility] hospital where she had a CT angiogram which was negative for pulmonary embolism. ECG done there was reported to show possible old anterior wall myocardial infarction and therefore she was sent here for further cardiac evaluation. Her symptoms are moderate to severe and she rates her pain at 8/10.  Past Medical History  Diagnosis Date  . Migraine   . Uterine prolapse   . Depression   . GERD (gastroesophageal reflux disease)   . Colovaginal fistula   . Diverticulosis     Past Surgical History  Procedure Laterality Date  . Sigmoid resection / rectopexy    . Colovaginal fistula repair    . Abdominal hysterectomy  1998  . Breast enhancement surgery  1997  . Appendectomy    . Hernia repair    . Tubal ligation    . Back surgery      lumbar disc  . Tonsillectomy    . Anterior and posterior vaginal repair      Family History  Problem Relation Age of Onset  . Colon cancer Neg Hx   . Diabetes Maternal Grandmother     History  Substance Use Topics  . Smoking status: Never Smoker   . Smokeless tobacco: Never Used  .  Alcohol Use: No    OB History   Grav Para Term Preterm Abortions TAB SAB Ect Mult Living   4 3 3  1 1    3       Review of Systems  Respiratory: Positive for shortness of breath.   Cardiovascular: Positive for chest pain.  All other systems reviewed and are negative.    Allergies  Codeine  Home Medications   Current Outpatient Rx  Name  Route  Sig  Dispense  Refill  . dibucaine (NUPERCAINAL) 1 % OINT   Rectal   Place 1 application rectally 3 (three) times daily as needed.         . docusate sodium (COLACE) 100 MG capsule   Oral   Take 200 mg by mouth daily.         . hyoscyamine (LEVSINEX) 0.375 MG 12 hr capsule   Oral   Take 0.375 mg by mouth as needed.         Marland Kitchen LORazepam (ATIVAN) 1 MG tablet   Oral   Take 1 mg by mouth as directed.         . Pantoprazole Sodium (PROTONIX PO)   Oral   Take 1 capsule by mouth daily.         . Polyethylene Glycol 3350 (MIRALAX PO)   Oral   Take by mouth as needed.         Marland Kitchen  sodium phosphate Pediatric (FLEET) 3.5-9.5 GM/59ML enema   Rectal   Place 1 enema rectally once.         . traMADol (ULTRAM) 50 MG tablet   Oral   Take 50 mg by mouth every 6 (six) hours as needed for pain.           BP 129/75  Pulse 83  Temp(Src) 98.3 F (36.8 C) (Oral)  Resp 20  Ht 5\' 2"  (1.575 m)  Wt 133 lb (60.328 kg)  BMI 24.32 kg/m2  SpO2 100%  Physical Exam  Nursing note and vitals reviewed.  57 year old female, resting comfortably and in no acute distress. Vital signs are normal. Oxygen saturation is 100%, which is normal. Head is normocephalic and atraumatic. PERRLA, EOMI. Oropharynx is clear. Neck is nontender and supple without adenopathy or JVD. Back is nontender and there is no CVA tenderness. Lungs are clear without rales, wheezes, or rhonchi. Chest is nontender. Heart has regular rate and rhythm without murmur. Abdomen is soft, flat, with moderate epigastric tenderness. There are no masses or  hepatosplenomegaly and peristalsis is normoactive. Lower abdomen is moderately tender but this is clearly related to her recent incision. Extremities have no cyanosis or edema, full range of motion is present. Skin is warm and dry without rash. Neurologic: Mental status is normal, cranial nerves are intact, there are no motor or sensory deficits.  ED Course  Procedures (including critical care time)  Results for orders placed during the hospital encounter of 09/08/12  CBC WITH DIFFERENTIAL      Result Value Range   WBC 5.7  4.0 - 10.5 K/uL   RBC 4.35  3.87 - 5.11 MIL/uL   Hemoglobin 11.9 (*) 12.0 - 15.0 g/dL   HCT 40.9 (*) 81.1 - 91.4 %   MCV 77.2 (*) 78.0 - 100.0 fL   MCH 27.4  26.0 - 34.0 pg   MCHC 35.4  30.0 - 36.0 g/dL   RDW 78.2  95.6 - 21.3 %   Platelets 309  150 - 400 K/uL   Neutrophils Relative % 49  43 - 77 %   Neutro Abs 2.8  1.7 - 7.7 K/uL   Lymphocytes Relative 37  12 - 46 %   Lymphs Abs 2.1  0.7 - 4.0 K/uL   Monocytes Relative 13 (*) 3 - 12 %   Monocytes Absolute 0.7  0.1 - 1.0 K/uL   Eosinophils Relative 1  0 - 5 %   Eosinophils Absolute 0.1  0.0 - 0.7 K/uL   Basophils Relative 1  0 - 1 %   Basophils Absolute 0.1  0.0 - 0.1 K/uL  CREATININE, SERUM      Result Value Range   Creatinine, Ser 0.69  0.50 - 1.10 mg/dL   GFR calc non Af Amer >90  >90 mL/min   GFR calc Af Amer >90  >90 mL/min  PRO B NATRIURETIC PEPTIDE      Result Value Range   Pro B Natriuretic peptide (BNP) 23.3  0 - 125 pg/mL  TROPONIN I      Result Value Range   Troponin I <0.30  <0.30 ng/mL  COMPREHENSIVE METABOLIC PANEL      Result Value Range   Sodium 139  135 - 145 mEq/L   Potassium 3.9  3.5 - 5.1 mEq/L   Chloride 101  96 - 112 mEq/L   CO2 26  19 - 32 mEq/L   Glucose, Bld 101 (*) 70 - 99 mg/dL  BUN 15  6 - 23 mg/dL   Creatinine, Ser 1.61  0.50 - 1.10 mg/dL   Calcium 9.6  8.4 - 09.6 mg/dL   Total Protein 7.4  6.0 - 8.3 g/dL   Albumin 3.8  3.5 - 5.2 g/dL   AST 22  0 - 37 U/L   ALT 11   0 - 35 U/L   Alkaline Phosphatase 72  39 - 117 U/L   Total Bilirubin 0.3  0.3 - 1.2 mg/dL   GFR calc non Af Amer >90  >90 mL/min   GFR calc Af Amer >90  >90 mL/min  LIPASE, BLOOD      Result Value Range   Lipase 67 (*) 11 - 59 U/L   Ct Angio Chest W/cm &/or Wo Cm  09/08/2012   *RADIOLOGY REPORT*  Clinical Data: Increasing shortness of breath and chest pressure.  CT ANGIOGRAPHY CHEST  Technique:  Multidetector CT imaging of the chest using the standard protocol during bolus administration of intravenous contrast. Multiplanar reconstructed images including MIPs were obtained and reviewed to evaluate the vascular anatomy.  Contrast: OMNIPAQUE IOHEXOL 350 MG/ML SOLN  Comparison: None.  Findings: There are no pulmonary emboli.  No infiltrates or effusions or mass lesions.  Benign intrapulmonary node along the minor fissure in the right lung.  Heart size and vascularity are normal.  The lungs are somewhat hyperinflated with slight peribronchial thickening which can be seen with bronchitis.  Slight bilateral apical pleural thickening which is chronic.  No osseous abnormality.  IMPRESSION: Slight peribronchial thickening suggesting mild bronchitis.  No pulmonary emboli.   Original Report Authenticated By: Francene Boyers, M.D.     ECG shows normal sinus rhythm with a rate of 81, no ectopy. Normal axis. Normal P wave. Normal QRS. Normal intervals. Normal ST and T waves. Impression: normal ECG. No prior ECG available for comparison.   1. Chest tightness or pressure   2. Difficulty breathing   3. Pancreatitis       MDM  Chest discomfort which is very unlikely to be cardiac given the fact that it has been constant for 5 days and there no ECG changes. Troponin level will be checked. The only clue on her exam is epigastric tenderness and I wonder if this may be related to GERD or gastritis. She will be given a therapeutic trial of a GI cocktail. Also, liver enzymes will be checked.  She got is  slight and momentary relief of pain with GI cocktail. Similarly we'll cover with ketorolac, there was slight relief of pain which was temporary. Workup has come back positive for pancreatitis with lipase of 67. Causes of pancreatitis is not clear. Since she has not been able to control pain at home, it was decided to admit her to the hospital. She has also been having problems with constipation related to narcotic use. Case is discussed with Dr. Toniann Fail [facility] who agrees to admit the patient.      Dione Booze, MD 09/08/12 [phone]

## 2012-09-08 NOTE — ED Notes (Addendum)
Pt transferred from [facility]  to [facility] room B16. Per ems- pt c/o chest pain, is here for consult with cardiology. Pt states she had SOB on Sunday, and CP started today. EKG changes per womens MD. No changes seen with ems. Pt currently denies cp. Still complaining of mild SOB. BP-130/80 HR-80, NSR. O2-98% on RA, RR-18, shallow

## 2012-09-08 NOTE — MAU Note (Signed)
Pt states had anterior/posterior vaginal repair at [facility]. This Sunday began experiencing SOB, has gradually worsened. Base of R lung bs diminished, otherwise wnl.

## 2012-09-08 NOTE — ED Notes (Signed)
Pt c/o dizziness, cp, sob. Pt wob is increased. Pt states pain is in right shoulder, also central, and sometimes in left arm.

## 2012-09-08 NOTE — MAU Note (Signed)
Surgery April 29th.

## 2012-09-08 NOTE — ED Notes (Signed)
Pt requesting Toradol for pain,  Dr. Preston Fleeting made aware of same

## 2012-09-08 NOTE — MAU Provider Note (Signed)
History     CSN: [id number]  Arrival date and time: 09/08/12 1244   None     Chief Complaint  Patient presents with  . Post-op Problem   HPI Margaret Richardson is 57 y.o. presents with shortness of breath, difficulty taking deep breaths X 3 days.  States her right shoulder discomfort and chest tenderness. She denies fever but has had chills.  She is a patient of Dr. Huel Coventry.  She had anterior/posterior repair on 08/17/12 at [facility].  Explains the surgery took longer than anticipated and that since the surgery she has been having pain.   She is taking tramadol for pain.   Past Medical History  Diagnosis Date  . Migraine   . Uterine prolapse   . Depression   . GERD (gastroesophageal reflux disease)   . Colovaginal fistula   . Diverticulosis     Past Surgical History  Procedure Laterality Date  . Sigmoid resection / rectopexy    . Colovaginal fistula repair    . Abdominal hysterectomy  1998  . Breast enhancement surgery  1997  . Appendectomy    . Hernia repair    . Tubal ligation    . Back surgery      lumbar disc    Family History  Problem Relation Age of Onset  . Colon cancer Neg Hx   . Diabetes Maternal Grandmother     History  Substance Use Topics  . Smoking status: Never Smoker   . Smokeless tobacco: Never Used  . Alcohol Use: No    Allergies:  Allergies  Allergen Reactions  . Codeine Nausea Only    Pt states she can take with promethazine    Prescriptions prior to admission  Medication Sig Dispense Refill  . dibucaine (NUPERCAINAL) 1 % OINT Place 1 application rectally 3 (three) times daily as needed.      . docusate sodium (COLACE) 100 MG capsule Take 200 mg by mouth daily.      . hyoscyamine (LEVSINEX) 0.375 MG 12 hr capsule Take 0.375 mg by mouth as needed.      Marland Kitchen LORazepam (ATIVAN) 1 MG tablet Take 1 mg by mouth as directed.      . Pantoprazole Sodium (PROTONIX PO) Take 1 capsule by mouth daily.      . Polyethylene Glycol 3350 (MIRALAX PO) Take by  mouth as needed.      . sodium phosphate Pediatric (FLEET) 3.5-9.5 GM/59ML enema Place 1 enema rectally once.      . traMADol (ULTRAM) 50 MG tablet Take 50 mg by mouth every 6 (six) hours as needed for pain.        Review of Systems  Constitutional: Positive for chills. Negative for fever.  Respiratory: Positive for shortness of breath. Negative for cough and hemoptysis.        Difficulty taking deep breaths.  Gastrointestinal: Negative for abdominal pain.   Physical Exam   Blood pressure 126/67, pulse 86, temperature 97.8 F (36.6 C), temperature source Oral, resp. rate 22, height 5\' 2"  (1.575 m), weight 133 lb (60.328 kg), SpO2 100.00%.  Physical Exam  Constitutional: She is oriented to person, place, and time. She appears well-developed and well-nourished. No distress.  HENT:  Head: Normocephalic.  Cardiovascular: Normal rate and normal heart sounds.   Respiratory: Effort normal and breath sounds normal. No respiratory distress. She has no wheezes. She has no rales.  Neurological: She is alert and oriented to person, place, and time.  Skin: Skin is warm and  dry.  Psychiatric: She has a normal mood and affect. Her behavior is normal.   Results for orders placed during the hospital encounter of 09/08/12 (from the past 24 hour(s))  CBC WITH DIFFERENTIAL     Status: Abnormal   Collection Time    09/08/12  2:35 PM      Result Value Range   WBC 5.7  4.0 - 10.5 K/uL   RBC 4.35  3.87 - 5.11 MIL/uL   Hemoglobin 11.9 (*) 12.0 - 15.0 g/dL   HCT 47.8 (*) 29.5 - 62.1 %   MCV 77.2 (*) 78.0 - 100.0 fL   MCH 27.4  26.0 - 34.0 pg   MCHC 35.4  30.0 - 36.0 g/dL   RDW 30.8  65.7 - 84.6 %   Platelets 309  150 - 400 K/uL   Neutrophils Relative % 49  43 - 77 %   Neutro Abs 2.8  1.7 - 7.7 K/uL   Lymphocytes Relative 37  12 - 46 %   Lymphs Abs 2.1  0.7 - 4.0 K/uL   Monocytes Relative 13 (*) 3 - 12 %   Monocytes Absolute 0.7  0.1 - 1.0 K/uL   Eosinophils Relative 1  0 - 5 %   Eosinophils  Absolute 0.1  0.0 - 0.7 K/uL   Basophils Relative 1  0 - 1 %   Basophils Absolute 0.1  0.0 - 0.1 K/uL  CREATININE, SERUM     Status: None   Collection Time    09/08/12  2:35 PM      Result Value Range   Creatinine, Ser 0.69  0.50 - 1.10 mg/dL   GFR calc non Af Amer >90  >90 mL/min   GFR calc Af Amer >90  >90 mL/min    EKG-Normal sinus rhythm with sinus arrhythmia; possible anterior infarct, age undetermined.  Abnormal EKG       *RADIOLOGY REPORT*  Clinical Data: Increasing shortness of breath and chest pressure.  CT ANGIOGRAPHY CHEST  Technique: Multidetector CT imaging of the chest using the  standard protocol during bolus administration of intravenous  contrast. Multiplanar reconstructed images including MIPs were  obtained and reviewed to evaluate the vascular anatomy.  Contrast: OMNIPAQUE IOHEXOL 350 MG/ML SOLN  Comparison: None.  Findings: There are no pulmonary emboli. No infiltrates or  effusions or mass lesions. Benign intrapulmonary node along the  minor fissure in the right lung.  Heart size and vascularity are normal.  The lungs are somewhat hyperinflated with slight peribronchial  thickening which can be seen with bronchitis. Slight bilateral  apical pleural thickening which is chronic. No osseous  abnormality.  IMPRESSION:  Slight peribronchial thickening suggesting mild bronchitis. No  pulmonary emboli.  Original Report Authenticated By: Francene Boyers, M.D.     MAU Course  Procedures  MDM 13:50  Reported MSE, patient's recent surgical history and PHI to Dr. Marcelle Overlie.  He states he will talk to Dr. Henderson Cloud and call back with orders.  14:23  Phone call from Dr. Marcelle Overlie with orders for CBC/diff, EKG and CT angiogram of the chest to R/O pulmonary embolus 15:20 Consulted with Dr Blima Dessert re: EKG results.  His interpretation was there were T wave abnormalities seen in anterior leads.  EKG for comparison would be helpful but not available.  He states this  is not STEMI, no need for cath lab.  Since age of abnormality unknown, enzymes may be helpful.   15:40 Reported labs, EKG,consult with Dr. Herma Carson and CT results to Dr.  [place].  She would like further evaluation at [facility] for EKG results and chest sxs.  He would like the cardiologist to decide if enzymes needed  Reported patients request for pain medication.  She last took Tramadol at 10am.  She is requesting no narcotic because she wants to avoid constipation with her bowel "issues".  Order given by Dr. Marcelle Overlie for Toradol 30mg  IV.  She is also nauseated, will give Zofran 4mg  IV. 16:00 Consulted with Dr. Preston Fleeting regarding need for further evaluation.  He accepted care. PLAN OF CARE discussed with patient.  Will transfer by [facility] Assessment and Plan  A:  Difficulty breathing       Chest discomfort      Pulmonary embolus ruled out     3 week post operative--anterior and posterior repairs  P:  Transfer to [facility] for further evaluation.  KEY,EVE M 09/08/2012, 1:29 PM

## 2012-09-09 ENCOUNTER — Inpatient Hospital Stay (HOSPITAL_COMMUNITY): Payer: PRIVATE HEALTH INSURANCE

## 2012-09-09 DIAGNOSIS — K859 Acute pancreatitis without necrosis or infection, unspecified: Secondary | ICD-10-CM

## 2012-09-09 DIAGNOSIS — R1013 Epigastric pain: Secondary | ICD-10-CM

## 2012-09-09 DIAGNOSIS — R079 Chest pain, unspecified: Secondary | ICD-10-CM

## 2012-09-09 DIAGNOSIS — R748 Abnormal levels of other serum enzymes: Secondary | ICD-10-CM

## 2012-09-09 LAB — COMPREHENSIVE METABOLIC PANEL
Albumin: 3.5 g/dL (ref 3.5–5.2)
Alkaline Phosphatase: 67 U/L (ref 39–117)
BUN: 17 mg/dL (ref 6–23)
Chloride: 100 mEq/L (ref 96–112)
GFR calc Af Amer: >90 mL/min (ref >90)
Glucose, Bld: 96 mg/dL (ref 70–99)
Potassium: 3.9 mEq/L (ref 3.5–5.1)
Total Bilirubin: 0.3 mg/dL (ref 0.3–1.2)

## 2012-09-09 LAB — CBC WITH DIFFERENTIAL/PLATELET
Basophils Absolute: 0 10*3/uL (ref 0.0–0.1)
Basophils Relative: 0 % (ref 0–1)
Eosinophils Relative: 1 % (ref 0–5)
Lymphocytes Relative: 25 % (ref 12–46)
MCHC: 35.1 g/dL (ref 30.0–36.0)
MCV: 76 fL — ABNORMAL LOW (ref 78.0–100.0)
Platelets: 282 10*3/uL (ref 150–400)
RDW: 13.5 % (ref 11.5–15.5)
WBC: 7 10*3/uL (ref 4.0–10.5)

## 2012-09-09 LAB — LIPASE, BLOOD: Lipase: 47 U/L (ref 11–59)

## 2012-09-09 MED ORDER — HYOSCYAMINE SULFATE CR 0.375 MG PO CP12: 0.3750 mg | ORAL_CAPSULE | ORAL | Status: DC | PRN

## 2012-09-09 MED ORDER — SODIUM CHLORIDE 0.9 % IJ SOLN
3.0000 mL | Freq: Two times a day (BID) | INTRAMUSCULAR | Status: DC
  Administered 2012-09-10: 3 mL via INTRAVENOUS

## 2012-09-09 MED ORDER — HYOSCYAMINE SULFATE ER 0.375 MG PO TB12
0.3750 mg | ORAL_TABLET | Freq: Two times a day (BID) | ORAL | Status: DC | PRN
  Administered 2012-09-09: 0.375 mg via ORAL
  Filled 2012-09-09 (×5): qty 1

## 2012-09-09 MED ORDER — ONDANSETRON HCL 4 MG PO TABS
4.0000 mg | ORAL_TABLET | Freq: Four times a day (QID) | ORAL | Status: DC | PRN
  Administered 2012-09-10: 4 mg via ORAL
  Filled 2012-09-09: qty 1

## 2012-09-09 MED ORDER — POLYETHYLENE GLYCOL 3350 17 G PO PACK
17.0000 g | PACK | Freq: Two times a day (BID) | ORAL | Status: DC
  Administered 2012-09-09 – 2012-09-10 (×3): 17 g via ORAL
  Filled 2012-09-09 (×4): qty 1

## 2012-09-09 MED ORDER — ONDANSETRON HCL 4 MG/2ML IJ SOLN
4.0000 mg | Freq: Four times a day (QID) | INTRAMUSCULAR | Status: DC | PRN
  Administered 2012-09-09: 4 mg via INTRAVENOUS
  Filled 2012-09-09: qty 2

## 2012-09-09 MED ORDER — DOCUSATE SODIUM 100 MG PO CAPS
200.0000 mg | ORAL_CAPSULE | Freq: Two times a day (BID) | ORAL | Status: DC
  Administered 2012-09-09 – 2012-09-10 (×2): 200 mg via ORAL
  Filled 2012-09-09 (×3): qty 2

## 2012-09-09 MED ORDER — TRAMADOL HCL 50 MG PO TABS: 50.0000 mg | ORAL_TABLET | Freq: Four times a day (QID) | ORAL | Status: DC | PRN

## 2012-09-09 MED ORDER — ACETAMINOPHEN 650 MG RE SUPP: 650.0000 mg | Freq: Four times a day (QID) | RECTAL | Status: DC | PRN

## 2012-09-09 MED ORDER — HYDROMORPHONE HCL PF 1 MG/ML IJ SOLN
1.0000 mg | INTRAMUSCULAR | Status: DC | PRN
  Administered 2012-09-09: 1 mg via INTRAVENOUS
  Filled 2012-09-09: qty 1

## 2012-09-09 MED ORDER — DIBUCAINE 1 % RE OINT: 1.0000 "application " | TOPICAL_OINTMENT | Freq: Three times a day (TID) | RECTAL | Status: DC | PRN

## 2012-09-09 MED ORDER — LORAZEPAM 1 MG PO TABS
1.0000 mg | ORAL_TABLET | Freq: Every day | ORAL | Status: DC
  Administered 2012-09-09 (×2): 1 mg via ORAL
  Filled 2012-09-09 (×2): qty 1

## 2012-09-09 MED ORDER — TRAMADOL HCL 50 MG PO TABS
50.0000 mg | ORAL_TABLET | Freq: Four times a day (QID) | ORAL | Status: DC | PRN
  Administered 2012-09-09 – 2012-09-10 (×4): 50 mg via ORAL
  Filled 2012-09-09 (×4): qty 1

## 2012-09-09 MED ORDER — BISACODYL 10 MG RE SUPP
10.0000 mg | Freq: Every day | RECTAL | Status: DC | PRN
  Administered 2012-09-09: 10 mg via RECTAL
  Filled 2012-09-09: qty 1

## 2012-09-09 MED ORDER — IOHEXOL 300 MG/ML  SOLN
25.0000 mL | INTRAMUSCULAR | Status: AC
  Administered 2012-09-09: 25 mL via ORAL

## 2012-09-09 MED ORDER — DOCUSATE SODIUM 100 MG PO CAPS
200.0000 mg | ORAL_CAPSULE | Freq: Every day | ORAL | Status: DC
  Administered 2012-09-09: 200 mg via ORAL
  Filled 2012-09-09: qty 2

## 2012-09-09 MED ORDER — ENOXAPARIN SODIUM 40 MG/0.4ML ~~LOC~~ SOLN
40.0000 mg | Freq: Every day | SUBCUTANEOUS | Status: DC
  Filled 2012-09-09 (×2): qty 0.4

## 2012-09-09 MED ORDER — SODIUM CHLORIDE 0.9 % IV SOLN
INTRAVENOUS | Status: DC
  Administered 2012-09-09 (×2): via INTRAVENOUS

## 2012-09-09 MED ORDER — ACETAMINOPHEN 325 MG PO TABS
650.0000 mg | ORAL_TABLET | Freq: Four times a day (QID) | ORAL | Status: DC | PRN
  Administered 2012-09-10: 650 mg via ORAL
  Filled 2012-09-09: qty 2

## 2012-09-09 MED ORDER — HYDROCODONE-ACETAMINOPHEN 5-325 MG PO TABS
1.0000 | ORAL_TABLET | Freq: Four times a day (QID) | ORAL | Status: DC | PRN
  Filled 2012-09-09: qty 1

## 2012-09-09 NOTE — Progress Notes (Signed)
Pt refusing to have troponin drawn at this time; will cont. To monitor.

## 2012-09-09 NOTE — H&P (Addendum)
[facility] History and Physical  PAIDEN CARAVEO ZOX:[id number] DOB: 22-Nov-1955 DOA: 09/08/2012  Referring physician: ER physician. PCP: Gaye Alken, NP   Chief Complaint: Chest pain and epigastric pain.  HPI: Margaret Richardson is a 57 y.o. female who has had recent rectocele and cystocele surgery at [facility] started experiencing chest pain and epigastric discomfort yesterday. Patient was told to go to to women's health by patient's OB/GYN. At [facility] health patient had CT angiogram of the chest which was negative for PE. Since patient's EKG was concerning patient was transferred to [facility] cone. EKG in the ER shows only normal sinus rhythm cardiac enzymes are negative lipase is mildly elevated. On exam patient does have significant abdominal tenderness at this time has been admitted for further management. Patient chest pain is pressure-like nonradiating increased on deep inspiration has mild shortness of breath. Denies any fever chills productive cough or diarrhea.  Review of Systems: As presented in the history of presenting illness, rest negative.  Past Medical History  Diagnosis Date  . Migraine   . Uterine prolapse   . Depression   . GERD (gastroesophageal reflux disease)   . Colovaginal fistula   . Diverticulosis    Past Surgical History  Procedure Laterality Date  . Sigmoid resection / rectopexy    . Colovaginal fistula repair    . Abdominal hysterectomy  1998  . Breast enhancement surgery  1997  . Appendectomy    . Hernia repair    . Tubal ligation    . Back surgery      lumbar disc  . Tonsillectomy    . Anterior and posterior vaginal repair     Social History:  reports that she has never smoked. She has never used smokeless tobacco. She reports that she does not drink alcohol or use illicit drugs. Lives at home. where does patient live-- Can do ADLs. Can patient participate in ADLs?  Allergies  Allergen Reactions  . Codeine Nausea Only    Pt states  she can take with promethazine    Family History  Problem Relation Age of Onset  . Colon cancer Neg Hx   . Diabetes Maternal Grandmother       Prior to Admission medications   Medication Sig Start Date End Date Taking? Authorizing Provider  dibucaine (NUPERCAINAL) 1 % OINT Place 1 application rectally 3 (three) times daily as needed.   Yes Historical Provider, MD  docusate sodium (COLACE) 100 MG capsule Take 200 mg by mouth daily.   Yes Historical Provider, MD  hyoscyamine (LEVSINEX) 0.375 MG 12 hr capsule Take 0.375 mg by mouth as needed.   Yes Historical Provider, MD  LORazepam (ATIVAN) 1 MG tablet Take 1 mg by mouth as directed.   Yes Historical Provider, MD  Pantoprazole Sodium (PROTONIX PO) Take 1 capsule by mouth daily.   Yes Historical Provider, MD  Polyethylene Glycol 3350 (MIRALAX PO) Take by mouth as needed.   Yes Historical Provider, MD  sodium phosphate Pediatric (FLEET) 3.5-9.5 GM/59ML enema Place 1 enema rectally once.   Yes Historical Provider, MD  traMADol (ULTRAM) 50 MG tablet Take 50 mg by mouth every 6 (six) hours as needed for pain.   Yes Historical Provider, MD   Physical Exam: Filed Vitals:   09/08/12 1610 09/08/12 1715 09/08/12 1716 09/08/12 1936  BP: 133/77 129/75 129/75 121/75  Pulse: 73 81 83 73  Temp: 98.1 F (36.7 C)  98.3 F (36.8 C)   TempSrc:   Oral   Resp:  18 13 20 18   Height:      Weight:      SpO2: 100% 100% 100% 100%     General:  Well-developed well-nourished.  Eyes: Anicteric no pallor.  ENT: No discharge from the ears eyes nose mouth.  Neck: No mass felt.  Cardiovascular: S1-S2 heard.  Respiratory: No rhonchi no crepitations.  Abdomen: Surgical scar seen. Patient does have significant epigastric tenderness.  Skin: Surgical scar seen.  Musculoskeletal: No edema.  Psychiatric: Appears normal.  Neurologic: Alert oriented to time place person. Moves all extremities.  Labs on Admission:  Basic Metabolic Panel:  Recent  Labs Lab 09/08/12 1435 09/08/12 1856  NA  --  139  K  --  3.9  CL  --  101  CO2  --  26  GLUCOSE  --  101*  BUN  --  15  CREATININE 0.69 0.72  CALCIUM  --  9.6   Liver Function Tests:  Recent Labs Lab 09/08/12 1856  AST 22  ALT 11  ALKPHOS 72  BILITOT 0.3  PROT 7.4  ALBUMIN 3.8    Recent Labs Lab 09/08/12 1856  LIPASE 67*   No results found for this basename: AMMONIA,  in the last 168 hours CBC:  Recent Labs Lab 09/08/12 1435  WBC 5.7  NEUTROABS 2.8  HGB 11.9*  HCT 33.6*  MCV 77.2*  PLT 309   Cardiac Enzymes:  Recent Labs Lab 09/08/12 1855  TROPONINI <0.30    BNP (last 3 results)  Recent Labs  09/08/12 1855  PROBNP 23.3   CBG: No results found for this basename: GLUCAP,  in the last 168 hours  Radiological Exams on Admission: Ct Angio Chest W/cm &/or Wo Cm  09/08/2012   *RADIOLOGY REPORT*  Clinical Data: Increasing shortness of breath and chest pressure.  CT ANGIOGRAPHY CHEST  Technique:  Multidetector CT imaging of the chest using the standard protocol during bolus administration of intravenous contrast. Multiplanar reconstructed images including MIPs were obtained and reviewed to evaluate the vascular anatomy.  Contrast: OMNIPAQUE IOHEXOL 350 MG/ML SOLN  Comparison: None.  Findings: There are no pulmonary emboli.  No infiltrates or effusions or mass lesions.  Benign intrapulmonary node along the minor fissure in the right lung.  Heart size and vascularity are normal.  The lungs are somewhat hyperinflated with slight peribronchial thickening which can be seen with bronchitis.  Slight bilateral apical pleural thickening which is chronic.  No osseous abnormality.  IMPRESSION: Slight peribronchial thickening suggesting mild bronchitis.  No pulmonary emboli.   Original Report Authenticated By: Francene Boyers, M.D.    EKG: Independently reviewed. Normal sinus rhythm.  Assessment/Plan Principal Problem:   Chest pain Active Problems:   GERD    Epigastric pain   Elevated lipase   1. Chest pain and epigastric discomfort - at this time patient's symptoms may be related to possible pancreatitis. I have ordered CT abdomen and pelvis with only by mouth contrast as patient is IV contrast for CT angiogram to rule out PE. Continue with pain medications and diet as tolerated. 2. Recent surgery for rectocele and cystocele at [facility]. 3. Anemia - follow CBC.    Code Status: Full code.  Family Communication: Patient's husband at the bedside.  Disposition Plan: Admit to inpatient.    Barba Solt N. [facility] Pager [phone].  If 7PM-7AM, please contact night-coverage [email or web address] Password [email or web address] 09/09/2012, 12:01 AM

## 2012-09-09 NOTE — Progress Notes (Signed)
Pt. Stated that they no longer wanted to be on fluids. The patient requested that the fluids be stopped. The nurse complied with the patient's wishes. Harmon Pier

## 2012-09-09 NOTE — Progress Notes (Signed)
Pt given dulcolax supp at this time per pt request; will cont. To monitor.

## 2012-09-09 NOTE — Progress Notes (Signed)
Pt requesting Dulcolax supp at this time; no orders; MD paged; will await callback.

## 2012-09-09 NOTE — Progress Notes (Signed)
Pt seen and examined, admitted earlier this am with  1. Atypical lower chest and upper abd pain, occasionally radiating to  Shoulder EKG, cardiac enzymes, CTA chest/CT abd pelvis all normal, except for moderate constipation Abd with mild tenderness around surgical sites, Lap Ports Narcotics PRN/bowel regimen Advance diet Home tomorrow  Zannie Cove, MD [phone]

## 2012-09-09 NOTE — Progress Notes (Signed)
Utilization Review Completed.Mecca Barga T5/22/2014  

## 2012-09-09 NOTE — Progress Notes (Signed)
Pt given IV Zofran at this time per pt request with IV Dilaudid; will cont. To monitor.

## 2012-09-09 NOTE — Progress Notes (Signed)
Pt requesting Tramadol instead of Vicodin; Tramadol d/c earlier; MD paged at this time; will await callback.

## 2012-09-09 NOTE — ED Notes (Signed)
Pt crying with c/o pain in chest and pelvis.  States she needs her lorazepam.  Monitor ST rate 111.  Abd soft, active bowel sounds.  Faamily at bedside.

## 2012-09-10 MED ORDER — POLYETHYLENE GLYCOL 3350 17 GM/SCOOP PO POWD: 17.0000 g | Freq: Two times a day (BID) | ORAL | Status: DC

## 2012-09-10 MED ORDER — TRAMADOL HCL 50 MG PO TABS: 50.0000 mg | ORAL_TABLET | Freq: Four times a day (QID) | ORAL | Status: DC | PRN

## 2012-09-10 MED ORDER — MAGNESIUM HYDROXIDE 400 MG/5ML PO SUSP
15.0000 mL | Freq: Every day | ORAL | Status: DC | PRN
  Administered 2012-09-10: 15 mL via ORAL
  Filled 2012-09-10: qty 30

## 2012-09-10 NOTE — Progress Notes (Signed)
Pt informed the nurse that she does not wish to be on the telemetry box. Pt stated that she does not have a cardiace hx, and informed the nurse that she has crappy insurance. Pt. asked the nurse if she could take off. The nurse educated and instructed the patient to keep the box on. The nurse informed the patient that he would call the attending physician to see if he could obtain an order to DC box. The patient again informed the nurse that she was going to remove regardless of what the Dr. said. The nurse notified K. Schorr (NP). K. Schorr articulated to the nurse that she was not going to write an order to DC the Box, because the patient would not consent to allowing the drawing of cardiac enzymes. The nurse informed the patient of K. Schorr decision, however, the patient removed the box anyway. The nurse educated the patient about the possible consequences of removing the box, consequently, the patient still decided upon removal. Central monitoring and the charge nurse Otilio Connors) were notified and informed. Harmon Pier

## 2012-09-10 NOTE — Progress Notes (Signed)
Pt refused vicodin tablet, requested tramadol instead. Wasted pill in sharps container - Judeth Cornfield Flippin RN witnessed disposal

## 2012-09-10 NOTE — Care Management Note (Addendum)
    Page 1 of 1   09/10/2012     4:19:25 PM   CARE MANAGEMENT NOTE 09/10/2012  Patient:  Margaret Richardson, Margaret Richardson   Account Number:  [id number]  Date Initiated:  09/10/2012  Documentation initiated by:  Kerby Hockley  Subjective/Objective Assessment:   PT ADM WITH CHEST PAIN; ENZYMES NEGATIVE.  PTA, PT INDEPENDENT, LIVES WITH SPOUSE.     Action/Plan:   WILL FOLLOW FOR HOME NEEDS AS PT PROGRESSES.   Anticipated DC Date:  09/10/2012   Anticipated DC Plan:  HOME/SELF CARE      DC Planning Services  CM consult      Choice offered to / List presented to:             Status of service:  Completed, signed off Medicare Important Message given?   (If response is "NO", the following Medicare IM given date fields will be blank) Date Medicare IM given:   Date Additional Medicare IM given:    Discharge Disposition:  HOME/SELF CARE  Per UR Regulation:  Reviewed for med. necessity/level of care/duration of stay  If discussed at Long Length of Stay Meetings, dates discussed:    Comments:

## 2012-09-10 NOTE — Progress Notes (Signed)
09/10/2012 4:34 PM Nursing note Discharge avs form, medications already taken today and those due this evening given and explained to patient. Follow up appointments and when to call MD reviewed. No iv access found. No tele found. Pt. Verbalized understanding. Questions and concerns addressed. D/c home with husband per orders.  [name], Blanchard Kelch

## 2012-09-10 NOTE — Progress Notes (Signed)
Nurse paged Kirtland Bouchard Schorr to notify her of telemetry removal from patient in rm 2023. Harmon Pier

## 2012-09-14 NOTE — Discharge Summary (Signed)
Physician Discharge Summary  Margaret Richardson ZOX:[id number] DOB: 1955/05/23 DOA: 09/08/2012  PCP: Gaye Alken, NP  Admit date: 09/08/2012 Discharge date: 09/10/2012  Time spent: 45 minutes  Recommendations for Outpatient Follow-up:  1. PCP in 1 week 2. Gyn in 1 week  Discharge Diagnoses:  Principal Problem:   Atypical Lower chest/Epigastric pain Active Problems:   GERD   Epigastric pain   Elevated lipase   Recent Laparoscopic Surgery   Depression   Anxiety  Discharge Condition: stable  Diet recommendation: bland diet advance as tolerated  Filed Weights   09/08/12 1315 09/09/12 0157 09/10/12 0345  Weight: 60.328 kg (133 lb) 61.1 kg (134 lb 11.2 oz) 61.4 kg (135 lb 5.8 oz)    History of present illness:  Margaret Richardson is a 57 y.o. female who has had recent rectocele and cystocele surgery at [facility] started experiencing chest pain and epigastric discomfort yesterday. Patient was told to go to to women's health by patient's OB/GYN. At [facility] health patient had CT angiogram of the chest which was negative for PE. Since patient's EKG was concerning patient was transferred to [facility]. EKG in the ER shows only normal sinus rhythm cardiac enzymes are negative lipase is mildly elevated. On exam patient does have significant abdominal tenderness at this time has been admitted for further management. Patient chest pain is pressure-like nonradiating increased on deep inspiration has mild shortness of breath. Denies any fever chills productive cough or diarrhea.   Hospital Course:  1. Atypical lower chest and upper abd pain, occasionally radiating to Shoulder  EKG, cardiac enzymes, CTA chest/CT abd pelvis all normal, except for moderate constipation  Abd with mild tenderness around surgical sites, Lap Ports  Pain likely related to recent surgery and constipation Improved with supportive care, laxatives Narcotics PRN/bowel regimen added and discharged home in stable  condition, was tolerating diet prior to discharge  Discharge Exam: Filed Vitals:   09/09/12 0157 09/09/12 1400 09/09/12 1936 09/10/12 0345  BP: 122/88 102/69 121/82 128/77  Pulse: 78 77 90 84  Temp: 98.1 F (36.7 C) 98.6 F (37 C) 98.4 F (36.9 C) 97.9 F (36.6 C)  TempSrc: Oral Oral Oral Oral  Resp: 18 18 18 18   Height: 5\' 2"  (1.575 m)     Weight: 61.1 kg (134 lb 11.2 oz)   61.4 kg (135 lb 5.8 oz)  SpO2: 100% 99% 100% 100%    General: AAOx3 Cardiovascular: S1S2/RRR Respiratory: CTAB  Discharge Instructions  Discharge Orders   Future Orders Complete By Expires     Discharge instructions  As directed     Comments:      Bland diet advance as tolerated        Do not drive or operate machinery while taking pain meds/sedatives        Medication List    STOP taking these medications       sodium phosphate Pediatric 3.5-9.5 GM/59ML enema      TAKE these medications       dibucaine 1 % Oint  Commonly known as:  NUPERCAINAL  Place 1 application rectally 3 (three) times daily as needed.     docusate sodium 100 MG capsule  Commonly known as:  COLACE  Take 200 mg by mouth daily.     hyoscyamine 0.375 MG 12 hr capsule  Commonly known as:  LEVSINEX  Take 0.375 mg by mouth as needed.     LORazepam 1 MG tablet  Commonly known as:  ATIVAN  Take 1  mg by mouth as directed.     polyethylene glycol powder powder  Commonly known as:  MIRALAX  Take 17 g by mouth 2 (two) times daily. Till regular BMS     PROTONIX PO  Take 1 capsule by mouth daily.     traMADol 50 MG tablet  Commonly known as:  ULTRAM  Take 1 tablet (50 mg total) by mouth every 6 (six) hours as needed for pain.       Allergies  Allergen Reactions  . Codeine Nausea Only    Pt states she can take with promethazine       Follow-up Information   Follow up with [facility], NP. Schedule an appointment as soon as possible for a visit in 1 week.   Contact information:   [address] [place] [address] [phone]        The results of significant diagnostics from this hospitalization (including imaging, microbiology, ancillary and laboratory) are listed below for reference.    Significant Diagnostic Studies: Ct Abdomen Pelvis Wo Contrast  09/09/2012   *RADIOLOGY REPORT*  Clinical Data: Abdominal pain.  CT ABDOMEN AND PELVIS WITHOUT CONTRAST  Technique:  Multidetector CT imaging of the abdomen and pelvis was performed following the standard protocol without intravenous contrast.  Comparison: 03/02/2007.  Findings: The lung bases are clear.  No pleural effusion.  The liver is unremarkable.  No focal hepatic lesions or intrahepatic biliary dilatation.  The gallbladder is filled with contrast.  This is likely a vicarious excretion from recent chest CT.  There is also contrast in both renal collecting systems from the chest CT.  A small fatty left renal lesion is likely a small angiomyolipoma.  The spleen is normal in size.  No focal lesions. The pancreas is grossly normal.  The adrenal glands are unremarkable.  The stomach, duodenum, small bowel and colon are unremarkable. There are surgical changes involving the sigmoid colon.  There is a moderate amount of stool throughout the colon suggesting constipation.  No inflammatory changes or mass lesions.  No mesenteric or retroperitoneal mass or adenopathy. The aorta is normal in caliber.  The bladder is filled with contrast from the chest CT.  It appears normal.  The uterus is surgically absent. Both ovaries are still present.  No pelvic mass, adenopathy or significant free pelvic fluid collections. 3 cm cystic structure in the presacral region could be associated with the right ovary or it could be a paraovarian cyst or peritoneal inclusion cyst.  I do not see any worrisome CT findings.  The bony structures are unremarkable.  IMPRESSION:  1.  No acute abdominal/pelvic findings. 2.  Moderate stool throughout the colon. 3.  Small left  renal angiomyolipoma. 4.  Contrast in the kidneys, bladder and gallbladder from recent chest CT. 5.  3 cm cystic structure in the central deep pelvis is more likely a benign para ovarian cyst, postoperative seroma or peritoneal inclusion cyst.   Original Report Authenticated By: Rudie Meyer, M.D.   Ct Angio Chest W/cm &/or Wo Cm  09/08/2012   *RADIOLOGY REPORT*  Clinical Data: Increasing shortness of breath and chest pressure.  CT ANGIOGRAPHY CHEST  Technique:  Multidetector CT imaging of the chest using the standard protocol during bolus administration of intravenous contrast. Multiplanar reconstructed images including MIPs were obtained and reviewed to evaluate the vascular anatomy.  Contrast: OMNIPAQUE IOHEXOL 350 MG/ML SOLN  Comparison: None.  Findings: There are no pulmonary emboli.  No infiltrates or effusions  or mass lesions.  Benign intrapulmonary node along the minor fissure in the right lung.  Heart size and vascularity are normal.  The lungs are somewhat hyperinflated with slight peribronchial thickening which can be seen with bronchitis.  Slight bilateral apical pleural thickening which is chronic.  No osseous abnormality.  IMPRESSION: Slight peribronchial thickening suggesting mild bronchitis.  No pulmonary emboli.   Original Report Authenticated By: Francene Boyers, M.D.    Microbiology: No results found for this or any previous visit (from the past 240 hour(s)).   Labs: Basic Metabolic Panel:  Recent Labs Lab 09/08/12 1435 09/08/12 1856 09/09/12 0420  NA  --  139 137  K  --  3.9 3.9  CL  --  101 100  CO2  --  26 26  GLUCOSE  --  101* 96  BUN  --  15 17  CREATININE 0.69 0.72 0.78  CALCIUM  --  9.6 9.3   Liver Function Tests:  Recent Labs Lab 09/08/12 1856 09/09/12 0420  AST 22 14  ALT 11 9  ALKPHOS 72 67  BILITOT 0.3 0.3  PROT 7.4 6.7  ALBUMIN 3.8 3.5    Recent Labs Lab 09/08/12 1856 09/09/12 0420  LIPASE 67* 47   No results found for this basename:  AMMONIA,  in the last 168 hours CBC:  Recent Labs Lab 09/08/12 1435 09/09/12 0420  WBC 5.7 7.0  NEUTROABS 2.8 4.4  HGB 11.9* 11.2*  HCT 33.6* 31.9*  MCV 77.2* 76.0*  PLT 309 282   Cardiac Enzymes:  Recent Labs Lab 09/08/12 1855 09/09/12 0420  TROPONINI <0.30 <0.30   BNP: BNP (last 3 results)  Recent Labs  09/08/12 1855  PROBNP 23.3   CBG: No results found for this basename: GLUCAP,  in the last 168 hours     Signed:  Benn Tarver  [facility] 09/14/2012, 7:48 PM

## 2012-11-07 ENCOUNTER — Inpatient Hospital Stay (HOSPITAL_COMMUNITY)
Admission: AD | Admit: 2012-11-07 | Discharge: 2012-11-09 | DRG: 948 | Disposition: A | Discharge status: Home / Self Care | Payer: PRIVATE HEALTH INSURANCE | Source: Ambulatory Visit | Attending: Obstetrics and Gynecology | Admitting: Obstetrics and Gynecology

## 2012-11-07 ENCOUNTER — Inpatient Hospital Stay (HOSPITAL_COMMUNITY): Payer: PRIVATE HEALTH INSURANCE

## 2012-11-07 ENCOUNTER — Encounter (HOSPITAL_COMMUNITY): Payer: Self-pay | Admitting: *Deleted

## 2012-11-07 DIAGNOSIS — K219 Gastro-esophageal reflux disease without esophagitis: Secondary | ICD-10-CM | POA: Diagnosis present

## 2012-11-07 DIAGNOSIS — R51 Headache: Secondary | ICD-10-CM | POA: Diagnosis present

## 2012-11-07 DIAGNOSIS — M545 Low back pain, unspecified: Secondary | ICD-10-CM | POA: Diagnosis present

## 2012-11-07 DIAGNOSIS — R209 Unspecified disturbances of skin sensation: Secondary | ICD-10-CM | POA: Diagnosis present

## 2012-11-07 DIAGNOSIS — N949 Unspecified condition associated with female genital organs and menstrual cycle: Secondary | ICD-10-CM | POA: Diagnosis present

## 2012-11-07 DIAGNOSIS — R109 Unspecified abdominal pain: Secondary | ICD-10-CM | POA: Diagnosis present

## 2012-11-07 DIAGNOSIS — G8918 Other acute postprocedural pain: Principal | ICD-10-CM | POA: Diagnosis present

## 2012-11-07 DIAGNOSIS — M48061 Spinal stenosis, lumbar region without neurogenic claudication: Secondary | ICD-10-CM | POA: Diagnosis present

## 2012-11-07 DIAGNOSIS — M5137 Other intervertebral disc degeneration, lumbosacral region: Secondary | ICD-10-CM | POA: Diagnosis present

## 2012-11-07 DIAGNOSIS — Z90710 Acquired absence of both cervix and uterus: Secondary | ICD-10-CM

## 2012-11-07 DIAGNOSIS — N824 Other female intestinal-genital tract fistulae: Secondary | ICD-10-CM | POA: Diagnosis present

## 2012-11-07 DIAGNOSIS — K5730 Diverticulosis of large intestine without perforation or abscess without bleeding: Secondary | ICD-10-CM | POA: Diagnosis present

## 2012-11-07 DIAGNOSIS — M51379 Other intervertebral disc degeneration, lumbosacral region without mention of lumbar back pain or lower extremity pain: Secondary | ICD-10-CM | POA: Diagnosis present

## 2012-11-07 LAB — URINALYSIS, ROUTINE W REFLEX MICROSCOPIC
Bilirubin Urine: NEGATIVE
Ketones, ur: NEGATIVE mg/dL
Leukocytes, UA: NEGATIVE
Nitrite: NEGATIVE
Protein, ur: NEGATIVE mg/dL
Urobilinogen, UA: 0.2 mg/dL (ref 0.0–1.0)

## 2012-11-07 LAB — CBC
Hemoglobin: 12.2 g/dL (ref 12.0–15.0)
MCH: 26.8 pg (ref 26.0–34.0)
MCHC: 34.9 g/dL (ref 30.0–36.0)
RDW: 14.6 % (ref 11.5–15.5)

## 2012-11-07 LAB — COMPREHENSIVE METABOLIC PANEL
ALT: 10 U/L (ref 0–35)
Albumin: 4.1 g/dL (ref 3.5–5.2)
Alkaline Phosphatase: 64 U/L (ref 39–117)
Calcium: 9.3 mg/dL (ref 8.4–10.5)
GFR calc Af Amer: >90 mL/min (ref >90)
Glucose, Bld: 100 mg/dL — ABNORMAL HIGH (ref 70–99)
Potassium: 3.8 mEq/L (ref 3.5–5.1)
Sodium: 137 mEq/L (ref 135–145)
Total Protein: 6.9 g/dL (ref 6.0–8.3)

## 2012-11-07 MED ORDER — KETOROLAC TROMETHAMINE 30 MG/ML IJ SOLN
30.0000 mg | Freq: Four times a day (QID) | INTRAMUSCULAR | Status: DC | PRN
  Administered 2012-11-08: 30 mg via INTRAVENOUS
  Filled 2012-11-07 (×3): qty 1

## 2012-11-07 MED ORDER — SODIUM CHLORIDE 0.9 % IJ SOLN: 9.0000 mL | INTRAMUSCULAR | Status: DC | PRN

## 2012-11-07 MED ORDER — LACTATED RINGERS IV SOLN
INTRAVENOUS | Status: DC
  Administered 2012-11-08 – 2012-11-09 (×5): via INTRAVENOUS

## 2012-11-07 MED ORDER — DIPHENHYDRAMINE HCL 12.5 MG/5ML PO ELIX: 12.5000 mg | ORAL_SOLUTION | Freq: Four times a day (QID) | ORAL | Status: DC | PRN

## 2012-11-07 MED ORDER — PRENATAL MULTIVITAMIN CH
1.0000 | ORAL_TABLET | Freq: Every day | ORAL | Status: DC
  Administered 2012-11-09 (×2): 1 via ORAL
  Filled 2012-11-07: qty 1

## 2012-11-07 MED ORDER — KETOROLAC TROMETHAMINE 30 MG/ML IJ SOLN
30.0000 mg | Freq: Four times a day (QID) | INTRAMUSCULAR | Status: DC | PRN
  Administered 2012-11-08 (×2): 30 mg via INTRAVENOUS

## 2012-11-07 MED ORDER — HYOSCYAMINE SULFATE ER 0.375 MG PO TB12
0.3750 mg | ORAL_TABLET | Freq: Two times a day (BID) | ORAL | Status: DC | PRN
  Administered 2012-11-07 – 2012-11-08 (×2): 0.375 mg via ORAL
  Filled 2012-11-07: qty 1

## 2012-11-07 MED ORDER — ACETAMINOPHEN 325 MG PO TABS
650.0000 mg | ORAL_TABLET | Freq: Four times a day (QID) | ORAL | Status: DC | PRN
  Administered 2012-11-08: 650 mg via ORAL
  Filled 2012-11-07: qty 2

## 2012-11-07 MED ORDER — DOCUSATE SODIUM 100 MG PO CAPS
100.0000 mg | ORAL_CAPSULE | Freq: Two times a day (BID) | ORAL | Status: DC
  Administered 2012-11-07 – 2012-11-09 (×3): 100 mg via ORAL
  Filled 2012-11-07 (×3): qty 1

## 2012-11-07 MED ORDER — ONDANSETRON HCL 4 MG/2ML IJ SOLN
4.0000 mg | Freq: Four times a day (QID) | INTRAMUSCULAR | Status: DC | PRN
  Administered 2012-11-07 – 2012-11-08 (×2): 4 mg via INTRAVENOUS

## 2012-11-07 MED ORDER — SIMETHICONE 80 MG PO CHEW: 80.0000 mg | CHEWABLE_TABLET | Freq: Four times a day (QID) | ORAL | Status: DC | PRN

## 2012-11-07 MED ORDER — LORAZEPAM 1 MG PO TABS
1.0000 mg | ORAL_TABLET | Freq: Every day | ORAL | Status: DC
  Administered 2012-11-07 – 2012-11-08 (×2): 1 mg via ORAL
  Filled 2012-11-07 (×2): qty 1

## 2012-11-07 MED ORDER — DIPHENHYDRAMINE HCL 50 MG/ML IJ SOLN: 12.5000 mg | Freq: Four times a day (QID) | INTRAMUSCULAR | Status: DC | PRN

## 2012-11-07 MED ORDER — HYDROMORPHONE 0.3 MG/ML IV SOLN
INTRAVENOUS | Status: DC
  Administered 2012-11-07: 20:00:00 via INTRAVENOUS
  Administered 2012-11-07: 0.2 mg via INTRAVENOUS
  Administered 2012-11-08 (×2): 1.19 mg via INTRAVENOUS
  Filled 2012-11-07: qty 25

## 2012-11-07 MED ORDER — NALOXONE HCL 0.4 MG/ML IJ SOLN: 0.4000 mg | INTRAMUSCULAR | Status: DC | PRN

## 2012-11-07 MED ORDER — ONDANSETRON HCL 4 MG/2ML IJ SOLN
4.0000 mg | Freq: Four times a day (QID) | INTRAMUSCULAR | Status: DC | PRN
  Administered 2012-11-08: 4 mg via INTRAVENOUS
  Filled 2012-11-07 (×3): qty 2

## 2012-11-07 MED ORDER — GABAPENTIN 300 MG PO CAPS
300.0000 mg | ORAL_CAPSULE | Freq: Every day | ORAL | Status: DC
  Administered 2012-11-07 – 2012-11-09 (×2): 300 mg via ORAL
  Filled 2012-11-07 (×4): qty 1

## 2012-11-07 MED ORDER — ONDANSETRON HCL 4 MG PO TABS: 4.0000 mg | ORAL_TABLET | Freq: Four times a day (QID) | ORAL | Status: DC | PRN

## 2012-11-07 NOTE — MAU Note (Signed)
Dr. Henderson Cloud here to evaluate pt. Orders received to observe pt.

## 2012-11-07 NOTE — MAU Note (Signed)
Bella Kennedy, RN notified of patient transfer of care. Room 303 given at this time.

## 2012-11-07 NOTE — MAU Note (Signed)
Pt presents to MAU with complaints of abdominal pain, pt states that she is 10weeks post op from a bladder sling, and surgical repair of her vaginal wall.

## 2012-11-07 NOTE — MAU Provider Note (Signed)
CC: Abdominal Pain    HPI Margaret Richardson is a 57 y.o. [phone] who presents 10 wks postop from extensive pelvic surgery done at [facility]. Since the surgery she's had unremitting pain. She also has had some incontinence of urine and stool. Voiding qs, but describes sensation that her bladder is enlarged and falling out of her vagina.Took Ultram7 hrs ago with some dulling of the constant pain that radiates to lower back and thighs. She has seen Dr. Huntley Dec postop.   Past Medical History  Diagnosis Date  . Migraine   . Uterine prolapse   . Depression   . GERD (gastroesophageal reflux disease)   . Colovaginal fistula   . Diverticulosis     OB History   Grav Para Term Preterm Abortions TAB SAB Ect Mult Living   4 3 3  1 1    3      # Outc Date GA Lbr Len/2nd Wgt Sex Del Anes PTL Lv   1 TAB            2 TRM            3 TRM            4 TRM               Past Surgical History  Procedure Laterality Date  . Sigmoid resection / rectopexy    . Colovaginal fistula repair    . Abdominal hysterectomy  1998  . Breast enhancement surgery  1997  . Appendectomy    . Hernia repair    . Tubal ligation    . Back surgery      lumbar disc  . Tonsillectomy    . Anterior and posterior vaginal repair      History   Social History  . Marital Status: Married    Spouse Name: N/A    Number of Children: N/A  . Years of Education: N/A   Occupational History  . Not on file.   Social History Main Topics  . Smoking status: Never Smoker   . Smokeless tobacco: Never Used  . Alcohol Use: No  . Drug Use: No  . Sexually Active: Yes   Other Topics Concern  . Not on file   Social History Narrative  . No narrative on file    No current facility-administered medications on file prior to encounter.   Current Outpatient Prescriptions on File Prior to Encounter  Medication Sig Dispense Refill  . docusate sodium (COLACE) 100 MG capsule Take 200 mg by mouth daily.      . hyoscyamine  (LEVSINEX) 0.375 MG 12 hr capsule Take 0.375 mg by mouth daily as needed for cramping.       Marland Kitchen LORazepam (ATIVAN) 1 MG tablet Take 1 mg by mouth at bedtime.       . polyethylene glycol powder (MIRALAX) powder Take 17 g by mouth 2 (two) times daily. Till regular BMS  255 g  0  . traMADol (ULTRAM) 50 MG tablet Take 1 tablet (50 mg total) by mouth every 6 (six) hours as needed for pain.  30 tablet  0    Allergies  Allergen Reactions  . Codeine Nausea Only    Pt states she can take with promethazine    ROS Pertinent items in HPI  PHYSICAL EXAM Filed Vitals:   11/07/12 1629  BP: 124/82  Pulse: 81  Temp: 97.9 F (36.6 C)  Resp: 18     LAB RESULTS Results for orders placed  during the hospital encounter of 11/07/12 (from the past 24 hour(s))  URINALYSIS, ROUTINE W REFLEX MICROSCOPIC     Status: None   Collection Time    11/07/12  4:30 PM      Result Value Range   Color, Urine YELLOW  YELLOW   APPearance CLEAR  CLEAR   Specific Gravity, Urine 1.025  1.005 - 1.030   pH 6.0  5.0 - 8.0   Glucose, UA NEGATIVE  NEGATIVE mg/dL   Hgb urine dipstick NEGATIVE  NEGATIVE   Bilirubin Urine NEGATIVE  NEGATIVE   Ketones, ur NEGATIVE  NEGATIVE mg/dL   Protein, ur NEGATIVE  NEGATIVE mg/dL   Urobilinogen, UA 0.2  0.0 - 1.0 mg/dL   Nitrite NEGATIVE  NEGATIVE   Leukocytes, UA NEGATIVE  NEGATIVE  CBC     Status: Abnormal   Collection Time    11/07/12  5:38 PM      Result Value Range   WBC 8.3  4.0 - 10.5 K/uL   RBC 4.56  3.87 - 5.11 MIL/uL   Hemoglobin 12.2  12.0 - 15.0 g/dL   HCT 16.1 (*) 09.6 - 04.5 %   MCV 76.8 (*) 78.0 - 100.0 fL   MCH 26.8  26.0 - 34.0 pg   MCHC 34.9  30.0 - 36.0 g/dL   RDW 40.9  81.1 - 91.4 %   Platelets 189  150 - 400 K/uL  COMPREHENSIVE METABOLIC PANEL     Status: Abnormal   Collection Time    11/07/12  5:38 PM      Result Value Range   Sodium 137  135 - 145 mEq/L   Potassium 3.8  3.5 - 5.1 mEq/L   Chloride 101  96 - 112 mEq/L   CO2 27  19 - 32 mEq/L    Glucose, Bld 100 (*) 70 - 99 mg/dL   BUN 17  6 - 23 mg/dL   Creatinine, Ser 7.82  0.50 - 1.10 mg/dL   Calcium 9.3  8.4 - 95.6 mg/dL   Total Protein 6.9  6.0 - 8.3 g/dL   Albumin 4.1  3.5 - 5.2 g/dL   AST 15  0 - 37 U/L   ALT 10  0 - 35 U/L   Alkaline Phosphatase 64  39 - 117 U/L   Total Bilirubin 0.3  0.3 - 1.2 mg/dL   GFR calc non Af Amer >90  >90 mL/min   GFR calc Af Amer >90  >90 mL/min    IMAGING No results found.  MAU COURSE 1800: Dr. Henderson Cloud here to see pt.   ASSESSMENT Abdominal and pelvic pain status post bladder sling and surgical repair 10 weeks ago   PLAN Dr.Tomblin will admit patient for observation, further evaluation and pain control  Danae Orleans, CNM 11/07/2012 5:55 PM

## 2012-11-07 NOTE — MAU Note (Signed)
Pt. Here complaining of abdominal pain s/p bladder sling and surgical repair that was done 10 weeks ago at [facility]. Pt has been having the same constant pain issues since her procedure. Took tramadol at home today and it only dulled the pain.  Was taking a compound medicine/cream that did help for 5 days but then stopped helping. Pain is more severe yesterday and today.

## 2012-11-07 NOTE — H&P (Signed)
Margaret Richardson is an 57 y.o. female S/P LAVH and A&P repair years ago.  Approximately 10 weeks ago had A&P repair with bilateral SSLS and cystoscopy/laparoscopy and midurethral sling with Dr Mia Creek at [facility]. Subsequently C/O razor like sensations in vagina. Returned to OR with Dr Mia Creek 3-4 weeks ago for trimming of intravaginal suture knots and cystoscopy. Bladder intact with good ureteral jets. Patient has continued to C/O of pain that travels and waxes and wanes. C/O bilateral low back pain, numbness in left great toe, "moving" razor like pains in vagina, pressure under bladder, pain over perineal body. She has been treated with a variety of medications including NSAIDS, tramadol, tylenol, lidocaine/gabapentin vaginal cream. Had great relief of vaginal/vulvar symptoms with vaginal cream for about 5 days. Now it is ineffective.  Today has taken tylenol, tramadol and vaginal cream. Pain is more severe. No N/V, fever, dysuria, hematuria, SOB, cough. Having BMs and voiding. No urine leakage with cough/sneeze after surgery.  Pertinent Gynecological History: Menses: N/A Bleeding: N/A Contraception: none DES exposure: denies Blood transfusions: unknown Sexually transmitted diseases: no past history Previous GYN Procedures: as above  Last mammogram: normal Date: 2014 Last pap: normal Date: 2014 OB History: G4, P3   Menstrual History: Menarche age: unknown  No LMP recorded. Patient has had a hysterectomy.    Past Medical History  Diagnosis Date  . Migraine   . Uterine prolapse   . Depression   . GERD (gastroesophageal reflux disease)   . Colovaginal fistula   . Diverticulosis     Past Surgical History  Procedure Laterality Date  . Sigmoid resection / rectopexy    . Colovaginal fistula repair    . Abdominal hysterectomy  1998  . Breast enhancement surgery  1997  . Appendectomy    . Hernia repair    . Tubal ligation    . Back surgery      lumbar disc  . Tonsillectomy     . Anterior and posterior vaginal repair      Family History  Problem Relation Age of Onset  . Colon cancer Neg Hx   . Diabetes Maternal Grandmother     Social History:  reports that she has never smoked. She has never used smokeless tobacco. She reports that she does not drink alcohol or use illicit drugs.  Allergies:  Allergies  Allergen Reactions  . Codeine Nausea Only    Pt states she can take with promethazine    Prescriptions prior to admission  Medication Sig Dispense Refill  . acetaminophen (TYLENOL) 500 MG tablet Take 500 mg by mouth every 6 (six) hours as needed for pain.      Marland Kitchen docusate sodium (COLACE) 100 MG capsule Take 200 mg by mouth daily.      . hyoscyamine (LEVSINEX) 0.375 MG 12 hr capsule Take 0.375 mg by mouth daily as needed for cramping.       . Lipo Cream Base CREA Place 1 application vaginally every 4 (four) hours.      Marland Kitchen LORazepam (ATIVAN) 1 MG tablet Take 1 mg by mouth at bedtime.       . polyethylene glycol powder (MIRALAX) powder Take 17 g by mouth 2 (two) times daily. Till regular BMS  255 g  0  . promethazine (PHENERGAN) 12.5 MG tablet Take 12.5 mg by mouth every 6 (six) hours as needed for nausea.      . traMADol (ULTRAM) 50 MG tablet Take 1 tablet (50 mg total) by mouth every 6 (  six) hours as needed for pain.  30 tablet  0    Review of Systems  Constitutional: Negative for fever.  HENT:       Mild HA on/off with increasing pelvic pain  Respiratory: Negative for cough and shortness of breath.   Skin: Negative for rash.  Neurological: Positive for headaches.    Blood pressure 124/82, pulse 81, temperature 97.9 F (36.6 C), temperature source Oral, resp. rate 18. Physical Exam  Constitutional: She appears well-developed and well-nourished.  Cardiovascular: Normal rate and regular rhythm.   Respiratory: Effort normal and breath sounds normal.  GI: Soft. Bowel sounds are normal. There is tenderness.  Mild pelvic pain with suprapubic  palpation No masses,no rebound, normal BS, flat  Genitourinary:  Unable to do pelvic exam due to patient discomfort Vulva appears normal    Results for orders placed during the hospital encounter of 11/07/12 (from the past 24 hour(s))  URINALYSIS, ROUTINE W REFLEX MICROSCOPIC     Status: None   Collection Time    11/07/12  4:30 PM      Result Value Range   Color, Urine YELLOW  YELLOW   APPearance CLEAR  CLEAR   Specific Gravity, Urine 1.025  1.005 - 1.030   pH 6.0  5.0 - 8.0   Glucose, UA NEGATIVE  NEGATIVE mg/dL   Hgb urine dipstick NEGATIVE  NEGATIVE   Bilirubin Urine NEGATIVE  NEGATIVE   Ketones, ur NEGATIVE  NEGATIVE mg/dL   Protein, ur NEGATIVE  NEGATIVE mg/dL   Urobilinogen, UA 0.2  0.0 - 1.0 mg/dL   Nitrite NEGATIVE  NEGATIVE   Leukocytes, UA NEGATIVE  NEGATIVE  CBC     Status: Abnormal   Collection Time    11/07/12  5:38 PM      Result Value Range   WBC 8.3  4.0 - 10.5 K/uL   RBC 4.56  3.87 - 5.11 MIL/uL   Hemoglobin 12.2  12.0 - 15.0 g/dL   HCT 78.2 (*) 95.6 - 21.3 %   MCV 76.8 (*) 78.0 - 100.0 fL   MCH 26.8  26.0 - 34.0 pg   MCHC 34.9  30.0 - 36.0 g/dL   RDW 08.6  57.8 - 46.9 %   Platelets 189  150 - 400 K/uL  COMPREHENSIVE METABOLIC PANEL     Status: Abnormal   Collection Time    11/07/12  5:38 PM      Result Value Range   Sodium 137  135 - 145 mEq/L   Potassium 3.8  3.5 - 5.1 mEq/L   Chloride 101  96 - 112 mEq/L   CO2 27  19 - 32 mEq/L   Glucose, Bld 100 (*) 70 - 99 mg/dL   BUN 17  6 - 23 mg/dL   Creatinine, Ser 6.29  0.50 - 1.10 mg/dL   Calcium 9.3  8.4 - 52.8 mg/dL   Total Protein 6.9  6.0 - 8.3 g/dL   Albumin 4.1  3.5 - 5.2 g/dL   AST 15  0 - 37 U/L   ALT 10  0 - 35 U/L   Alkaline Phosphatase 64  39 - 117 U/L   Total Bilirubin 0.3  0.3 - 1.2 mg/dL   GFR calc non Af Amer >90  >90 mL/min   GFR calc Af Amer >90  >90 mL/min    No results found.  Assessment/Plan: 57 yo S/P A&P repair with bilateral SSLS, midurethral sling, L/S about 10 weeks ago  with pelvic/perineal body/ bilat low  abdominal pain/numbness of great toe D/W patient and husband-will place in observation bed, IV fluids, PCA, prn IV toradol, gabapentin Will get pelvic U/S Straight cath UA for C&S and to check residual volume MRI tomorrow-D/W radiologist-will order lumbar and pelvic studies Following above will consider neuro consultation  Aneisa Karren II,Captola Teschner E 11/07/2012, 6:38 PM

## 2012-11-08 ENCOUNTER — Ambulatory Visit (HOSPITAL_COMMUNITY): Admit: 2012-11-08 | Payer: PRIVATE HEALTH INSURANCE

## 2012-11-08 ENCOUNTER — Ambulatory Visit (HOSPITAL_COMMUNITY): Payer: PRIVATE HEALTH INSURANCE

## 2012-11-08 MED ORDER — NALOXONE HCL 0.4 MG/ML IJ SOLN: 0.4000 mg | INTRAMUSCULAR | Status: DC | PRN

## 2012-11-08 MED ORDER — POLYETHYLENE GLYCOL 3350 17 G PO PACK
17.0000 g | PACK | Freq: Every day | ORAL | Status: DC
  Administered 2012-11-08 – 2012-11-09 (×2): 17 g via ORAL
  Filled 2012-11-08 (×3): qty 1

## 2012-11-08 MED ORDER — PANTOPRAZOLE SODIUM 40 MG IV SOLR
40.0000 mg | Freq: Every day | INTRAVENOUS | Status: DC
  Filled 2012-11-08 (×3): qty 40

## 2012-11-08 MED ORDER — SODIUM CHLORIDE 0.9 % IJ SOLN: 9.0000 mL | INTRAMUSCULAR | Status: DC | PRN

## 2012-11-08 MED ORDER — PROMETHAZINE HCL 25 MG/ML IJ SOLN: 12.5000 mg | Freq: Four times a day (QID) | INTRAMUSCULAR | Status: DC | PRN

## 2012-11-08 MED ORDER — DEXTROSE 5 % IV SOLN
500.0000 mg | Freq: Once | INTRAVENOUS | Status: AC
  Administered 2012-11-08: 500 mg via INTRAVENOUS
  Filled 2012-11-08: qty 5

## 2012-11-08 MED ORDER — FENTANYL 10 MCG/ML IV SOLN
INTRAVENOUS | Status: DC
  Administered 2012-11-08: 12:00:00 via INTRAVENOUS
  Filled 2012-11-08: qty 50

## 2012-11-08 MED ORDER — ONDANSETRON HCL 4 MG/2ML IJ SOLN: 4.0000 mg | Freq: Four times a day (QID) | INTRAMUSCULAR | Status: DC | PRN

## 2012-11-08 MED ORDER — DEXAMETHASONE SODIUM PHOSPHATE 4 MG/ML IJ SOLN
4.0000 mg | Freq: Three times a day (TID) | INTRAMUSCULAR | Status: DC
  Administered 2012-11-08 – 2012-11-09 (×2): 4 mg via INTRAVENOUS
  Filled 2012-11-08 (×7): qty 1

## 2012-11-08 MED ORDER — DIPHENHYDRAMINE HCL 50 MG/ML IJ SOLN: 12.5000 mg | Freq: Four times a day (QID) | INTRAMUSCULAR | Status: DC | PRN

## 2012-11-08 MED ORDER — DIPHENHYDRAMINE HCL 12.5 MG/5ML PO ELIX: 12.5000 mg | ORAL_SOLUTION | Freq: Four times a day (QID) | ORAL | Status: DC | PRN

## 2012-11-08 MED ORDER — SUMATRIPTAN 20 MG/ACT NA SOLN
20.0000 mg | NASAL | Status: DC | PRN
  Administered 2012-11-08 (×2): 20 mg via NASAL
  Filled 2012-11-08: qty 1

## 2012-11-08 MED ORDER — TRAMADOL HCL 50 MG PO TABS
50.0000 mg | ORAL_TABLET | Freq: Four times a day (QID) | ORAL | Status: DC | PRN
  Administered 2012-11-08 – 2012-11-09 (×3): 50 mg via ORAL
  Filled 2012-11-08 (×3): qty 1

## 2012-11-08 MED ORDER — SUMATRIPTAN 20 MG/ACT NA SOLN
20.0000 mg | NASAL | Status: DC | PRN
Start: 2012-11-08 — End: 2012-11-08
  Filled 2012-11-08: qty 1

## 2012-11-08 NOTE — Progress Notes (Signed)
Pt unable to go to [facility] for MRI mid-day d/t severe headache and vomiting.  MRI rescheduled for tomorrow at [facility] where conscious sedation will be available. Multiple medications, positioning, heat/cold therapies, and therapeutic listening tried during day to relieve symptoms.  Vital signs remain stable, pt ambulatory, and pt voids without difficulty. Dr. Henderson Cloud phoned and was updated on pt's condition.  New order received for Phenergan PRN.

## 2012-11-08 NOTE — Consult Note (Signed)
NEURO HOSPITALIST CONSULT NOTE    Reason for Consult: HA associated with Dilaudid and left toe numbness.   HPI:                                                                                                                                          Margaret Richardson is an 57 y.o. female who "approximately 10 weeks ago had A&P repair with bilateral SSLS and cystoscopy/laparoscopy and midurethral sling with Dr Mia Creek at [facility]. Subsequently C/O razor like sensations in vagina. Returned to OR with Dr Mia Creek 3-4 weeks ago for trimming of intravaginal suture knots and cystoscopy." Now is complaining of 2 week history of left leg and left great toe pain/ numbness. She is able to feel her perineum and is able to hold her urine and feel when she need to urinate. She feels her HA is due to Dilaudid as she had no HA prior to having dilaudid. At this point she tells me she does not want to use Dilaudid due to the SE. Currently she is complaining of a 9/10 HA bilateral temporal region, throbbing.  As for her left toe numbness, on exam is is only her left toe, no dermatomal distribution and does not go beyond her proximal phalynx.     Past Medical History  Diagnosis Date  . Migraine   . Uterine prolapse   . Depression   . GERD (gastroesophageal reflux disease)   . Colovaginal fistula   . Diverticulosis     Past Surgical History  Procedure Laterality Date  . Sigmoid resection / rectopexy    . Colovaginal fistula repair    . Abdominal hysterectomy  1998  . Breast enhancement surgery  1997  . Appendectomy    . Hernia repair    . Tubal ligation    . Back surgery      lumbar disc  . Tonsillectomy    . Anterior and posterior vaginal repair      Family History  Problem Relation Age of Onset  . Colon cancer Neg Hx   . Diabetes Maternal Grandmother      Social History:  reports that she has never smoked. She has never used smokeless tobacco. She reports that she  does not drink alcohol or use illicit drugs.  Allergies  Allergen Reactions  . Codeine Nausea Only    Pt states she can take with promethazine    MEDICATIONS:  Scheduled: . dexamethasone  4 mg Intravenous Q8H  . docusate sodium  100 mg Oral BID  . gabapentin  300 mg Oral Daily  . HYDROmorphone PCA 0.3 mg/mL   Intravenous Q4H  . LORazepam  1 mg Oral QHS  . pantoprazole (PROTONIX) IV  40 mg Intravenous Daily  . prenatal multivitamin  1 tablet Oral Q1200  . valproate sodium  500 mg Intravenous Once     ROS:                                                                                                                                       History obtained from the patient  General ROS: negative for - chills, fatigue, fever, night sweats, weight gain or weight loss Psychological ROS: negative for - behavioral disorder, hallucinations, memory difficulties, mood swings or suicidal ideation Ophthalmic ROS: negative for - blurry vision, double vision, eye pain or loss of vision ENT ROS: negative for - epistaxis, nasal discharge, oral lesions, sore throat, tinnitus or vertigo Allergy and Immunology ROS: negative for - hives or itchy/watery eyes Hematological and Lymphatic ROS: negative for - bleeding problems, bruising or swollen lymph nodes Endocrine ROS: negative for - galactorrhea, hair pattern changes, polydipsia/polyuria or temperature intolerance Respiratory ROS: negative for - cough, hemoptysis, shortness of breath or wheezing Cardiovascular ROS: negative for - chest pain, dyspnea on exertion, edema or irregular heartbeat Gastrointestinal ROS: negative for - abdominal pain, diarrhea, hematemesis, nausea/vomiting or stool incontinence Genito-Urinary ROS: negative for - dysuria, hematuria, incontinence or urinary frequency/urgency Musculoskeletal ROS: negative for - joint  swelling or muscular weakness Neurological ROS: as noted in HPI Dermatological ROS: negative for rash and skin lesion changes   Blood pressure 120/70, pulse 78, temperature 97.5 F (36.4 C), temperature source Oral, resp. rate 12, height 5\' 1"  (1.549 m), weight 55.112 kg (121 lb 8 oz), SpO2 97.00%.   Neurologic Examination:                                                                                                      Mental Status: Alert, oriented, thought content appropriate.  Speech fluent without evidence of aphasia.  Able to follow 3 step commands without difficulty. Cranial Nerves: II: Discs flat bilaterally; Visual fields grossly normal, pupils equal, round, reactive to light and accommodation III,IV, VI: ptosis not present, extra-ocular motions intact bilaterally V,VII: smile symmetric, facial light touch sensation normal bilaterally VIII: hearing normal bilaterally IX,X: gag reflex present XI: bilateral shoulder shrug XII: midline  tongue extension Motor: Right : Upper extremity   5/5    Left:     Upper extremity   5/5  Lower extremity   5/5     Lower extremity   5/5 Tone and bulk:normal tone throughout; no atrophy noted Sensory: Pinprick and light touch intact throughout, bilaterally Deep Tendon Reflexes:  Right: Upper Extremity   Left: Upper extremity   biceps (C-5 to C-6) 2/4   biceps (C-5 to C-6) 2/4 tricep (C7) 2/4    triceps (C7) 2/4 Brachioradialis (C6) 2/4  Brachioradialis (C6) 2/4  Lower Extremity Lower Extremity  quadriceps (L-2 to L-4) 2/4   quadriceps (L-2 to L-4) 2/4 Achilles (S1) 2/4   Achilles (S1) 2/4  Plantars: Right: downgoing   Left: downgoing Cerebellar: normal finger-to-nose,  normal heel-to-shin test CV: pulses palpable throughout    Lab Results  Component Value Date/Time   CHOL 138 01/18/2008  9:36 AM    Results for orders placed during the hospital encounter of 11/07/12 (from the past 48 hour(s))  URINALYSIS, ROUTINE W REFLEX  MICROSCOPIC     Status: None   Collection Time    11/07/12  4:30 PM      Result Value Range   Color, Urine YELLOW  YELLOW   APPearance CLEAR  CLEAR   Specific Gravity, Urine 1.025  1.005 - 1.030   pH 6.0  5.0 - 8.0   Glucose, UA NEGATIVE  NEGATIVE mg/dL   Hgb urine dipstick NEGATIVE  NEGATIVE   Bilirubin Urine NEGATIVE  NEGATIVE   Ketones, ur NEGATIVE  NEGATIVE mg/dL   Protein, ur NEGATIVE  NEGATIVE mg/dL   Urobilinogen, UA 0.2  0.0 - 1.0 mg/dL   Nitrite NEGATIVE  NEGATIVE   Leukocytes, UA NEGATIVE  NEGATIVE   Comment: MICROSCOPIC NOT DONE ON URINES WITH NEGATIVE PROTEIN, BLOOD, LEUKOCYTES, NITRITE, OR GLUCOSE <1000 mg/dL.  CBC     Status: Abnormal   Collection Time    11/07/12  5:38 PM      Result Value Range   WBC 8.3  4.0 - 10.5 K/uL   RBC 4.56  3.87 - 5.11 MIL/uL   Hemoglobin 12.2  12.0 - 15.0 g/dL   HCT 16.1 (*) 09.6 - 04.5 %   MCV 76.8 (*) 78.0 - 100.0 fL   MCH 26.8  26.0 - 34.0 pg   MCHC 34.9  30.0 - 36.0 g/dL   RDW 40.9  81.1 - 91.4 %   Platelets 189  150 - 400 K/uL  COMPREHENSIVE METABOLIC PANEL     Status: Abnormal   Collection Time    11/07/12  5:38 PM      Result Value Range   Sodium 137  135 - 145 mEq/L   Potassium 3.8  3.5 - 5.1 mEq/L   Chloride 101  96 - 112 mEq/L   CO2 27  19 - 32 mEq/L   Glucose, Bld 100 (*) 70 - 99 mg/dL   BUN 17  6 - 23 mg/dL   Creatinine, Ser 7.82  0.50 - 1.10 mg/dL   Calcium 9.3  8.4 - 95.6 mg/dL   Total Protein 6.9  6.0 - 8.3 g/dL   Albumin 4.1  3.5 - 5.2 g/dL   AST 15  0 - 37 U/L   ALT 10  0 - 35 U/L   Alkaline Phosphatase 64  39 - 117 U/L   Total Bilirubin 0.3  0.3 - 1.2 mg/dL   GFR calc non Af Amer >90  >90 mL/min  GFR calc Af Amer >90  >90 mL/min   Comment:            The eGFR has been calculated     using the CKD EPI equation.     This calculation has not been     validated in all clinical     situations.     eGFR's persistently     <90 mL/min signify     possible Chronic Kidney Disease.    US Pelvis  Complete  11/07/2012   *RADIOLOGY REPORT*  Clinical Data: Right lower quadrant pain.  TRANSABDOMINAL ULTRASOUND OF PELVIS  Technique:  Transabdominal ultrasound examination of the pelvis was performed including evaluation of the uterus, ovaries, adnexal regions, and pelvic cul-de-sac.  Comparison:  CT abdomen and pelvis 09/09/2012.  Findings:  Uterus:  Removed  Endometrium: Not applicable.  Right ovary: Not visualized.  Left ovary: Not visualized.  Other Findings:  No fluid collection is identified.  IMPRESSION:  1.  Status post hysterectomy.  Ovaries not visualized. 2.  Negative for fluid collection.   Original Report Authenticated By: Holley Dexter, M.D.     Assessment/Plan: 57 YO female with HA and chronic/intermittent/waxing and waning left leg and large toe numbness which does not follow dermatomal distribution.  Patient states she believes her HA is due to Dilaudid which is currently being changed by her primary.  Alternative possibilities are migraine.  Recommend: 1) Depakote 500 mg one time dose and dexamethasone 4mg  TID for three days (with IV protonix).  2) MRI brain with contrast has also been ordered to evaluate for structural cause of HA and abnormal left leg symptoms.   Plan: Follow up on : 1) MRI brain 2) MRI L-spine 3) reaction to Depakote and decadron.   Assessment and plan discussed with with attending physician and they are in agreement.    Felicie Morn PA-C [facility] [phone]  11/08/2012, 10:11 AM  Patient seen and examined together with physician assistant and I concur with the assessment and plan.  Wyatt Portela, MD

## 2012-11-08 NOTE — Progress Notes (Signed)
C/O severe HA and N/V , possibly due to dilaudid?, this am. Dilaudid dulling pelvic/abd pain  VSS Afeb Lungs CTA Cor RRR Abd soft  Neuro seeing patient now-D/W neuro   A/P: Stop dilaudid         D/W pharmacy-will begin fentanyl PCA         MRI today including head per neuro         Will D/C imitrex ordered this am-not given yet         Decadron/depakote per neuro         After above, will formulate plan for po pain control         Neuro to FU tomorrow

## 2012-11-08 NOTE — Progress Notes (Signed)
Margaret Richardson is having a difficult time coping with the pain since her surgery.  She was really receptive to using relaxation techniques to deal with some of the tension that she feels.  She has had a difficult few years with several losses and family complications.  Her pain prevented [place] from talking further about those. I will pass this information on to [facility] for follow up over the next few days.  [id number] [place] Pager, [phone] 12:56 PM   11/08/12 1200  Clinical Encounter Type  Visited With Patient  Visit Type Spiritual support

## 2012-11-09 ENCOUNTER — Ambulatory Visit (HOSPITAL_COMMUNITY)
Admit: 2012-11-09 | Discharge: 2012-11-09 | Disposition: A | Discharge status: Home / Self Care | Payer: PRIVATE HEALTH INSURANCE | Attending: Obstetrics and Gynecology | Admitting: Obstetrics and Gynecology

## 2012-11-09 ENCOUNTER — Ambulatory Visit (HOSPITAL_COMMUNITY)
Admit: 2012-11-09 | Discharge: 2012-11-09 | Disposition: A | Discharge status: Home / Self Care | Payer: PRIVATE HEALTH INSURANCE | Attending: Neurology | Admitting: Neurology

## 2012-11-09 ENCOUNTER — Ambulatory Visit (HOSPITAL_COMMUNITY): Payer: PRIVATE HEALTH INSURANCE

## 2012-11-09 ENCOUNTER — Encounter (HOSPITAL_COMMUNITY): Payer: Self-pay | Admitting: Radiology

## 2012-11-09 ENCOUNTER — Telehealth (HOSPITAL_COMMUNITY): Payer: Self-pay | Admitting: *Deleted

## 2012-11-09 ENCOUNTER — Other Ambulatory Visit (HOSPITAL_COMMUNITY): Payer: PRIVATE HEALTH INSURANCE

## 2012-11-09 ENCOUNTER — Ambulatory Visit (HOSPITAL_COMMUNITY): Admit: 2012-11-09 | Payer: PRIVATE HEALTH INSURANCE

## 2012-11-09 DIAGNOSIS — R51 Headache: Secondary | ICD-10-CM

## 2012-11-09 MED ORDER — FENTANYL CITRATE 0.05 MG/ML IJ SOLN
INTRAMUSCULAR | Status: AC
  Filled 2012-11-09: qty 4

## 2012-11-09 MED ORDER — GADOBENATE DIMEGLUMINE 529 MG/ML IV SOLN
15.0000 mL | Freq: Once | INTRAVENOUS | Status: AC
  Administered 2012-11-09: 15 mL via INTRAVENOUS

## 2012-11-09 MED ORDER — GADOBENATE DIMEGLUMINE 529 MG/ML IV SOLN
15.0000 mL | Freq: Once | INTRAVENOUS | Status: AC
  Administered 2012-11-09: 12 mL via INTRAVENOUS

## 2012-11-09 MED ORDER — MIDAZOLAM HCL 2 MG/2ML IJ SOLN: 1.0000 mg | INTRAMUSCULAR | Status: DC | PRN

## 2012-11-09 MED ORDER — FENTANYL CITRATE 0.05 MG/ML IJ SOLN
25.0000 ug | INTRAMUSCULAR | Status: DC | PRN
  Administered 2012-11-09 (×3): 50 ug via INTRAVENOUS

## 2012-11-09 MED ORDER — MIDAZOLAM HCL 2 MG/2ML IJ SOLN
INTRAMUSCULAR | Status: AC
  Filled 2012-11-09: qty 10

## 2012-11-09 MED ORDER — DULOXETINE HCL 30 MG PO CPEP: 30.0000 mg | ORAL_CAPSULE | Freq: Every day | ORAL | Status: DC

## 2012-11-09 MED ORDER — FENTANYL CITRATE 0.05 MG/ML IJ SOLN: 25.0000 ug | INTRAMUSCULAR | Status: DC | PRN

## 2012-11-09 MED ORDER — MIDAZOLAM HCL 2 MG/2ML IJ SOLN
1.0000 mg | INTRAMUSCULAR | Status: DC | PRN
  Administered 2012-11-09: 2 mg via INTRAVENOUS
  Administered 2012-11-09: 1 mg via INTRAVENOUS
  Administered 2012-11-09 (×2): 2 mg via INTRAVENOUS
  Administered 2012-11-09: 1 mg via INTRAVENOUS

## 2012-11-09 NOTE — Discharge Summary (Signed)
Physician Discharge Summary  Patient ID: Margaret Richardson MRN: [id number] DOB/AGE: 57-Jan-1957 57 y.o.  Admit date: 11/07/2012 Discharge date: 11/09/2012  Admission Diagnoses:abdominal pelvic pain  Discharge Diagnoses: abdominal pelvic pain, L4-5, L5-S1 degenerative disc changes Active Problems:   * No active hospital problems. *   Discharged Condition: good  Hospital Course: Admitted with C/O abdominal/pelvic/vaginal pain S/P A&P repair with bilateral SSLS and midurethral sling about 10 weeks ago. Placed on PCA dilaudid because patient stated she had done well on dilaudid previously and felt bad on morphine. MRI of pelvis and lumbar spine ordered for next day. On night of admission developed HA with N/V. Neuro consultation obtained who recommended MRI of head in addition to other studies. Neuro ordered decadron x 4 and depakote x 1. Dilaudid discontinued and fentanyl PCA ordered. Patient vomiting too much to tolerate nasal canula oxygen and pulse oximetry needed for fentanyl. Patient had used imitrex in past and requested same. Neuro initially recommended not using this but later OK it. After 2 doses of imitrex nasal spray and diluadid off, HA resolved. Unable to transfer for MRI due to N/V. Next day felt better and transported to [facility] for MRI with conscious sedation. MRI brain normal, MRI pelvis with 4.3 cm thin walled septated right adnexal cyst with benign features, and MRI of lumbar spine with degenerative changes of L4-5 and L5-S1. Patient stated had L4 surgery in [place], [place]. Now tolerating regular diet, having BM, voiding and ambulating. Adequate pain relief with tramadol. D/W options-will begin Cymbalta. Continue tramadol and lidocaine/gabapetin vaginal cream prn. Will refer for evaluation of lumbar spine findings.  Consults: neurology  Significant Diagnostic Studies: labs:  Results for orders placed during the hospital encounter of 11/07/12 (from the past 72 hour(s))  URINALYSIS,  ROUTINE W REFLEX MICROSCOPIC     Status: None   Collection Time    11/07/12  4:30 PM      Result Value Range   Color, Urine YELLOW  YELLOW   APPearance CLEAR  CLEAR   Specific Gravity, Urine 1.025  1.005 - 1.030   pH 6.0  5.0 - 8.0   Glucose, UA NEGATIVE  NEGATIVE mg/dL   Hgb urine dipstick NEGATIVE  NEGATIVE   Bilirubin Urine NEGATIVE  NEGATIVE   Ketones, ur NEGATIVE  NEGATIVE mg/dL   Protein, ur NEGATIVE  NEGATIVE mg/dL   Urobilinogen, UA 0.2  0.0 - 1.0 mg/dL   Nitrite NEGATIVE  NEGATIVE   Leukocytes, UA NEGATIVE  NEGATIVE   Comment: MICROSCOPIC NOT DONE ON URINES WITH NEGATIVE PROTEIN, BLOOD, LEUKOCYTES, NITRITE, OR GLUCOSE <1000 mg/dL.  CBC     Status: Abnormal   Collection Time    11/07/12  5:38 PM      Result Value Range   WBC 8.3  4.0 - 10.5 K/uL   RBC 4.56  3.87 - 5.11 MIL/uL   Hemoglobin 12.2  12.0 - 15.0 g/dL   HCT 21.3 (*) 08.6 - 57.8 %   MCV 76.8 (*) 78.0 - 100.0 fL   MCH 26.8  26.0 - 34.0 pg   MCHC 34.9  30.0 - 36.0 g/dL   RDW 46.9  62.9 - 52.8 %   Platelets 189  150 - 400 K/uL  COMPREHENSIVE METABOLIC PANEL     Status: Abnormal   Collection Time    11/07/12  5:38 PM      Result Value Range   Sodium 137  135 - 145 mEq/L   Potassium 3.8  3.5 - 5.1 mEq/L  Chloride 101  96 - 112 mEq/L   CO2 27  19 - 32 mEq/L   Glucose, Bld 100 (*) 70 - 99 mg/dL   BUN 17  6 - 23 mg/dL   Creatinine, Ser 1.19  0.50 - 1.10 mg/dL   Calcium 9.3  8.4 - 14.7 mg/dL   Total Protein 6.9  6.0 - 8.3 g/dL   Albumin 4.1  3.5 - 5.2 g/dL   AST 15  0 - 37 U/L   ALT 10  0 - 35 U/L   Alkaline Phosphatase 64  39 - 117 U/L   Total Bilirubin 0.3  0.3 - 1.2 mg/dL   GFR calc non Af Amer >90  >90 mL/min   GFR calc Af Amer >90  >90 mL/min   Comment:            The eGFR has been calculated     using the CKD EPI equation.     This calculation has not been     validated in all clinical     situations.     eGFR's persistently     <90 mL/min signify     possible Chronic Kidney Disease.   and  radiology: MRI: see above and Ultrasound: ovaries not identified on transabdominal study  Treatments: IV hydration, analgesia: Dilaudid and fentanyl, tramadol, imitrex and steroids: dexamethasone  Discharge Exam: Blood pressure 129/63, pulse 83, temperature 98.3 F (36.8 C), temperature source Oral, resp. rate 18, height 5\' 1"  (1.549 m), weight 121 lb 8 oz (55.112 kg), SpO2 100.00%. General appearance: alert, cooperative and no distress GI: soft, BS normal x 4, mild suprapubic tenderness without rebound  Disposition: 01-Home or Self Care     Medication List    STOP taking these medications       docusate sodium 100 MG capsule  Commonly known as:  COLACE      TAKE these medications       acetaminophen 500 MG tablet  Commonly known as:  TYLENOL  Take 500 mg by mouth every 6 (six) hours as needed for pain.     DULoxetine 30 MG capsule  Commonly known as:  CYMBALTA  Take 1 capsule (30 mg total) by mouth daily.     hyoscyamine 0.375 MG 12 hr capsule  Commonly known as:  LEVSINEX  Take 0.375 mg by mouth daily as needed for cramping.     Lipo Cream Base Crea  Place 1 application vaginally every 4 (four) hours.     LORazepam 1 MG tablet  Commonly known as:  ATIVAN  Take 1 mg by mouth at bedtime.     polyethylene glycol powder powder  Commonly known as:  MIRALAX  Take 17 g by mouth 2 (two) times daily. Till regular BMS     promethazine 12.5 MG tablet  Commonly known as:  PHENERGAN  Take 12.5 mg by mouth every 6 (six) hours as needed for nausea.     traMADol 50 MG tablet  Commonly known as:  ULTRAM  Take 1 tablet (50 mg total) by mouth every 6 (six) hours as needed for pain.           Follow-up Information   Follow up In 1 day.      Signed: Retta Mac E 11/09/2012, 8:27 PM

## 2012-11-09 NOTE — Progress Notes (Signed)
HA gone Tolerated MRI Vaginal symptoms of suture knots feel like "razors" continue Had BM before MRI, ate all of supper C/O heaviness of bladder Was on antidepressant-either Celexa or Effexor-until about 4 months ago, weaned herself off  VSS Afeb Smiling   MRI-brain normal         Lumbar-some degenerative/disc changes (patient states history of L4-5 surgery)         Pelvic-approximately 4.3 cm thin walled septated right adnexal cyst with no nodules-possible peritubal cyst or hydrosalpinx  D/W patient and husband above Reviewed options , will D/C to home, FU in office tomorrow for pelvic exam  Tramadol prn-patient has some at home, can use lidocaine/gabapentin vaginal cream prn per orders Cymbalta 30mg , #7, 1 po qd NR, then Cymbalta 60mg , #30, 1 po qd R-2-side effects reviewed

## 2012-11-09 NOTE — Progress Notes (Signed)
Will refer for evaluation of lumbar findings

## 2012-11-09 NOTE — H&P (Signed)
HPI: Margaret Richardson is an 57 y.o. female with multiple c/o including back pain, headaches, numbness tingling in LE. She is scheduled for MRI of head, back, pelvis and requires sedation to complete the exam. PMHx and meds reviewed.  Past Medical History:  Past Medical History  Diagnosis Date  . Migraine   . Uterine prolapse   . Depression   . GERD (gastroesophageal reflux disease)   . Colovaginal fistula   . Diverticulosis     Past Surgical History:  Past Surgical History  Procedure Laterality Date  . Sigmoid resection / rectopexy    . Colovaginal fistula repair    . Abdominal hysterectomy  1998  . Breast enhancement surgery  1997  . Appendectomy    . Hernia repair    . Tubal ligation    . Back surgery      lumbar disc  . Tonsillectomy    . Anterior and posterior vaginal repair      Family History:  Family History  Problem Relation Age of Onset  . Colon cancer Neg Hx   . Diabetes Maternal Grandmother     Social History:  reports that she has never smoked. She has never used smokeless tobacco. She reports that she does not drink alcohol or use illicit drugs.  Allergies:  Allergies  Allergen Reactions  . Codeine Nausea Only    Pt states she can take with promethazine    Medications:   Medication List    ASK your doctor about these medications       acetaminophen 500 MG tablet  Commonly known as:  TYLENOL  Take 500 mg by mouth every 6 (six) hours as needed for pain.     docusate sodium 100 MG capsule  Commonly known as:  COLACE  Take 200 mg by mouth daily.     hyoscyamine 0.375 MG 12 hr capsule  Commonly known as:  LEVSINEX  Take 0.375 mg by mouth daily as needed for cramping.     Lipo Cream Base Crea  Place 1 application vaginally every 4 (four) hours.     LORazepam 1 MG tablet  Commonly known as:  ATIVAN  Take 1 mg by mouth at bedtime.     polyethylene glycol powder powder  Commonly known as:  MIRALAX  Take 17 g by mouth 2 (two) times  daily. Till regular BMS     promethazine 12.5 MG tablet  Commonly known as:  PHENERGAN  Take 12.5 mg by mouth every 6 (six) hours as needed for nausea.     traMADol 50 MG tablet  Commonly known as:  ULTRAM  Take 1 tablet (50 mg total) by mouth every 6 (six) hours as needed for pain.        Please HPI for pertinent positives, otherwise complete 10 system ROS negative.  Physical Exam: BP 132/74  Pulse 64  Temp(Src) 97.8 F (36.6 C) (Oral)  Resp 20  Ht 5\' 1"  (1.549 m)  Wt 121 lb 8 oz (55.112 kg)  BMI 22.97 kg/m2  SpO2 100% Body mass index is 22.97 kg/(m^2).   General Appearance:  Alert, cooperative, no distress, appears stated age  Head:  Normocephalic, without obvious abnormality, atraumatic  ENT: Unremarkable  Neck: Supple, symmetrical, trachea midline  Lungs:   Clear to auscultation bilaterally, no w/r/r, respirations unlabored without use of accessory muscles.  Chest Wall:  No tenderness or deformity  Heart:  Regular rate and rhythm, S1, S2 normal, no murmur, rub or gallop.  Neurologic: Normal  affect, no gross deficits.   Results for orders placed during the hospital encounter of 11/07/12 (from the past 48 hour(s))  URINALYSIS, ROUTINE W REFLEX MICROSCOPIC     Status: None   Collection Time    11/07/12  4:30 PM      Result Value Range   Color, Urine YELLOW  YELLOW   APPearance CLEAR  CLEAR   Specific Gravity, Urine 1.025  1.005 - 1.030   pH 6.0  5.0 - 8.0   Glucose, UA NEGATIVE  NEGATIVE mg/dL   Hgb urine dipstick NEGATIVE  NEGATIVE   Bilirubin Urine NEGATIVE  NEGATIVE   Ketones, ur NEGATIVE  NEGATIVE mg/dL   Protein, ur NEGATIVE  NEGATIVE mg/dL   Urobilinogen, UA 0.2  0.0 - 1.0 mg/dL   Nitrite NEGATIVE  NEGATIVE   Leukocytes, UA NEGATIVE  NEGATIVE   Comment: MICROSCOPIC NOT DONE ON URINES WITH NEGATIVE PROTEIN, BLOOD, LEUKOCYTES, NITRITE, OR GLUCOSE <1000 mg/dL.  CBC     Status: Abnormal   Collection Time    11/07/12  5:38 PM      Result Value Range   WBC  8.3  4.0 - 10.5 K/uL   RBC 4.56  3.87 - 5.11 MIL/uL   Hemoglobin 12.2  12.0 - 15.0 g/dL   HCT 16.1 (*) 09.6 - 04.5 %   MCV 76.8 (*) 78.0 - 100.0 fL   MCH 26.8  26.0 - 34.0 pg   MCHC 34.9  30.0 - 36.0 g/dL   RDW 40.9  81.1 - 91.4 %   Platelets 189  150 - 400 K/uL  COMPREHENSIVE METABOLIC PANEL     Status: Abnormal   Collection Time    11/07/12  5:38 PM      Result Value Range   Sodium 137  135 - 145 mEq/L   Potassium 3.8  3.5 - 5.1 mEq/L   Chloride 101  96 - 112 mEq/L   CO2 27  19 - 32 mEq/L   Glucose, Bld 100 (*) 70 - 99 mg/dL   BUN 17  6 - 23 mg/dL   Creatinine, Ser 7.82  0.50 - 1.10 mg/dL   Calcium 9.3  8.4 - 95.6 mg/dL   Total Protein 6.9  6.0 - 8.3 g/dL   Albumin 4.1  3.5 - 5.2 g/dL   AST 15  0 - 37 U/L   ALT 10  0 - 35 U/L   Alkaline Phosphatase 64  39 - 117 U/L   Total Bilirubin 0.3  0.3 - 1.2 mg/dL   GFR calc non Af Amer >90  >90 mL/min   GFR calc Af Amer >90  >90 mL/min   Comment:            The eGFR has been calculated     using the CKD EPI equation.     This calculation has not been     validated in all clinical     situations.     eGFR's persistently     <90 mL/min signify     possible Chronic Kidney Disease.   US Pelvis Complete  11/07/2012   *RADIOLOGY REPORT*  Clinical Data: Right lower quadrant pain.  TRANSABDOMINAL ULTRASOUND OF PELVIS  Technique:  Transabdominal ultrasound examination of the pelvis was performed including evaluation of the uterus, ovaries, adnexal regions, and pelvic cul-de-sac.  Comparison:  CT abdomen and pelvis 09/09/2012.  Findings:  Uterus:  Removed  Endometrium: Not applicable.  Right ovary: Not visualized.  Left ovary: Not visualized.  Other Findings:  No fluid collection is identified.  IMPRESSION:  1.  Status post hysterectomy.  Ovaries not visualized. 2.  Negative for fluid collection.   Original Report Authenticated By: Holley Dexter, M.D.    Assessment/Plan Headaches, back pain, LE tingling. For MRI head, back, pelvis with  conscious sedation Discussed sedation, adverse effects, possible nausea. Consent signed in chart  Brayton El PA-C 11/09/2012, 11:12 AM

## 2012-11-09 NOTE — Progress Notes (Signed)
Nutrition Brief Note  Patient identified on the Malnutrition Screening Tool (MST) Report  Body mass index is 22.97 kg/(m^2). Patient meets criteria for mod to severe degree of malnutrition  based on a reported approx 20 % loss of usual weight. 30 Lb weight loss in 10 weeks due to pain and loss of appetite.    Current diet order is NPO, patient is consuming approximately 0% of meals due to pain, and  nausea, vomiting from headache. Labs and medications reviewed. Pt for MRI today.  No nutrition interventions warranted at this time, due to NPO status. If pt able to eat, resume regular diet and order Ensure Complete BID.  Estimated needs 15-1700 Kcal/day, 55 - 65 g protein/day   If nutrition issues arise, please consult RD.   Elisabeth Cara M.Odis Luster LDN Neonatal Nutrition Support Specialist Pager [phone]

## 2012-11-09 NOTE — Telephone Encounter (Incomplete)
Allergies ***  Adverse Drug Reactions ***  Current Medications ***   Why is your doctor ordering the exam? ***  Medical History ***  Previous Hospitalizations ***  Chronic diseases or disabilities ***  Any previous sedations/surgeries/intubations ***  Sedation ordered ***  Orders and H & P sent to Pediatrics: Date *** Time *** Initals ***       May have milk/solids until ***  May have clear liquids until ***  Sleep deprivation  Bring child's favorite toy, blanket, pacifier, etc.  Please be aware, no more than two people can accompany patient during the procedure. A parent or legal guardian must accompany the child. Please do not bring other children.  Call [phone] if child is febrile, has nausea, and vomiting etc. 24 hours prior to or day of exam. The exam may be rescheduled.    

## 2012-11-09 NOTE — Progress Notes (Signed)
HA gone C/O some vaginal discomfort Unable to do MRI yesterday due to N/V with HA Thinks she can do MRI today with sedation Ambulating, voiding, tolerating regular diet with good appetite States she can feel ends of some suture knots in vagina with self digital exam  VSS Afeb  A: Vaginal / Pelvic pain      HA now resolved     Numb Toe  P: D/C PCA     [facility] where conscious sedation with study prn     D/W patient above-will reevaluate after MRI report available

## 2012-11-09 NOTE — Progress Notes (Addendum)
NEURO HOSPITALIST PROGRESS NOTE   SUBJECTIVE:                                                                                                                        Patient is currently HA free.  States the Imitrex was the medication that stopped the HA.  Does not feel Depakote helped but does feel the dexamethasone is of some help. Patient did not receive the MRI and is planned to have this today.   OBJECTIVE:                                                                                                                           Vital signs in last 24 hours: Temp:  [98 F (36.7 C)-98.7 F (37.1 C)] 98.7 F (37.1 C) (07/22 0520) Pulse Rate:  [76-94] 89 (07/22 0520) Resp:  [16-20] 20 (07/22 0646) BP: (122-140)/(62-76) 129/76 mmHg (07/22 0520) SpO2:  [99 %-100 %] 100 % (07/22 0646)  Intake/Output from previous day: 07/21 0701 - 07/22 0700 In: 3095 [P.O.:720; I.V.:2375] Out: 4100 [Urine:3650; Emesis/NG output:450] Intake/Output this shift:   Nutritional status: NPO  Past Medical History  Diagnosis Date  . Migraine   . Uterine prolapse   . Depression   . GERD (gastroesophageal reflux disease)   . Colovaginal fistula   . Diverticulosis      Neurologic Exam:  Mental Status: Alert, oriented, thought content appropriate.  Speech fluent without evidence of aphasia.  Able to follow 3 step commands without difficulty. Cranial Nerves: II: Discs flat bilaterally; Visual fields grossly normal, pupils equal, round, reactive to light and accommodation III,IV, VI: ptosis not present, extra-ocular motions intact bilaterally V,VII: smile symmetric, facial light touch sensation normal bilaterally VIII: hearing normal bilaterally IX,X: gag reflex present XI: bilateral shoulder shrug XII: midline tongue extension Motor: Right : Upper extremity   5/5    Left:     Upper extremity   5/5  Lower extremity   5/5     Lower extremity   5/5 Tone and  bulk:normal tone throughout; no atrophy noted Sensory: Pinprick and light touch intact throughout, bilaterally Deep Tendon Reflexes:  Right: Upper Extremity   Left: Upper extremity   biceps (C-5 to C-6) 2/4  biceps (C-5 to C-6) 2/4 tricep (C7) 2/4    triceps (C7) 2/4 Brachioradialis (C6) 2/4  Brachioradialis (C6) 2/4  Lower Extremity Lower Extremity  quadriceps (L-2 to L-4) 2/4   quadriceps (L-2 to L-4) 2/4 Achilles (S1) 2/4   Achilles (S1) 2/4  Plantars: Right: downgoing   Left: downgoing Cerebellar: normal finger-to-nose,  normal heel-to-shin test CV: pulses palpable throughout    Lab Results: Lab Results  Component Value Date/Time   CHOL 138 01/18/2008  9:36 AM   Lipid Panel No results found for this basename: CHOL, TRIG, HDL, CHOLHDL, VLDL, LDLCALC,  in the last 72 hours  Studies/Results: US Pelvis Complete  11/07/2012   *RADIOLOGY REPORT*  Clinical Data: Right lower quadrant pain.  TRANSABDOMINAL ULTRASOUND OF PELVIS  Technique:  Transabdominal ultrasound examination of the pelvis was performed including evaluation of the uterus, ovaries, adnexal regions, and pelvic cul-de-sac.  Comparison:  CT abdomen and pelvis 09/09/2012.  Findings:  Uterus:  Removed  Endometrium: Not applicable.  Right ovary: Not visualized.  Left ovary: Not visualized.  Other Findings:  No fluid collection is identified.  IMPRESSION:  1.  Status post hysterectomy.  Ovaries not visualized. 2.  Negative for fluid collection.   Original Report Authenticated By: Holley Dexter, M.D.    MEDICATIONS                                                                                                                        Scheduled: . dexamethasone  4 mg Intravenous Q8H  . docusate sodium  100 mg Oral BID  . fentaNYL   Intravenous Q4H  . gabapentin  300 mg Oral Daily  . LORazepam  1 mg Oral QHS  . pantoprazole (PROTONIX) IV  40 mg Intravenous Daily  . polyethylene glycol  17 g Oral Daily  . prenatal  multivitamin  1 tablet Oral Q1200    ASSESSMENT/PLAN:                                                                                                             57 YO female with chronic HA which resolved after administration of Imitrex. Again left toe pain does not follow any dermatomal distribution  And patient does not note any trauma to toe or ankle to explain peripheral trauma.   Recommend: 1) Continue Dexamethasone if patient feels it is helping.  2) systemic neurontin may be of benefit for her left leg pain but this would take time to build in her system.  I have explained this to patient  and that this may be considered as a out patient.  3) If MRI is normal no further acute in patient recommendations.  4) patient would benefit from a out patient neurology follow up for both chronic control and left leg pain which may be worked up as a peripheral neuropathy/mononeuropathy.  [facility] [address] [place], [place] [address]--[phone] or [facility] neurology [address] [place], [place], [place] [address] [phone]   Assessment and plan discussed with with attending physician and they are in agreement.    Felicie Morn PA-C [facility] [phone]  11/09/2012, 8:38 AM

## 2012-11-09 NOTE — Progress Notes (Signed)
Patient discharged home with spouse.  Discharged instructions reviewed with patient and spouse, verbalized understanding of instructions.  To car via wheelchair

## 2012-11-09 NOTE — Progress Notes (Signed)
11/09/12 1000  Clinical Encounter Type  Visited With Family  Visit Type Follow-up;Spiritual support;Social support  Spiritual Encounters  Spiritual Needs Emotional (pt seeking tools for relaxation, holistic healing/pain mgmt)  Stress Factors  Patient Stress Factors (pain)   Met Ms Mancini briefly as she prepared for trip to [facility] for MRI.  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  She is interested in exploring complementary/holistic healing options and has requested further support in progressive relaxation/guided meditation.  We plan to follow up for spiritual support and relaxation/meditation either this afternoon or tomorrow morning, as schedules allow.  [address] [place], [place] [phone]

## 2013-02-24 ENCOUNTER — Other Ambulatory Visit: Payer: Self-pay

## 2014-02-20 ENCOUNTER — Encounter (HOSPITAL_COMMUNITY): Payer: Self-pay | Admitting: Radiology

## 2014-08-24 ENCOUNTER — Other Ambulatory Visit (HOSPITAL_COMMUNITY): Payer: Self-pay | Admitting: Dermatology

## 2015-04-18 ENCOUNTER — Telehealth: Payer: Self-pay | Admitting: Internal Medicine

## 2015-04-18 NOTE — Telephone Encounter (Signed)
Patient with rectal bleeding.  She believes that is may be a rectal fissure.  She will come in and see Arta Bruce, PA at 2:30 04/25/15

## 2015-04-25 ENCOUNTER — Ambulatory Visit: Payer: PRIVATE HEALTH INSURANCE | Admitting: Physician Assistant

## 2015-05-04 ENCOUNTER — Encounter: Payer: Self-pay | Admitting: Physician Assistant

## 2015-05-04 ENCOUNTER — Ambulatory Visit (INDEPENDENT_AMBULATORY_CARE_PROVIDER_SITE_OTHER): Payer: PRIVATE HEALTH INSURANCE | Admitting: Physician Assistant

## 2015-05-04 ENCOUNTER — Other Ambulatory Visit (INDEPENDENT_AMBULATORY_CARE_PROVIDER_SITE_OTHER): Payer: PRIVATE HEALTH INSURANCE

## 2015-05-04 VITALS — BP 104/64 | HR 83 | Ht 62.0 in | Wt 139.0 lb

## 2015-05-04 DIAGNOSIS — K648 Other hemorrhoids: Secondary | ICD-10-CM | POA: Diagnosis not present

## 2015-05-04 DIAGNOSIS — K644 Residual hemorrhoidal skin tags: Secondary | ICD-10-CM

## 2015-05-04 DIAGNOSIS — K625 Hemorrhage of anus and rectum: Secondary | ICD-10-CM

## 2015-05-04 LAB — CBC WITH DIFFERENTIAL/PLATELET
BASOS PCT: 0.6 % (ref 0.0–3.0)
Basophils Absolute: 0.1 10*3/uL (ref 0.0–0.1)
EOS ABS: 0 10*3/uL (ref 0.0–0.7)
EOS PCT: 0.6 % (ref 0.0–5.0)
HCT: 39.2 % (ref 36.0–46.0)
HEMOGLOBIN: 13.2 g/dL (ref 12.0–15.0)
LYMPHS ABS: 2.1 10*3/uL (ref 0.7–4.0)
Lymphocytes Relative: 26.5 % (ref 12.0–46.0)
MCHC: 33.8 g/dL (ref 30.0–36.0)
MCV: 81.6 fl (ref 78.0–100.0)
MONO ABS: 0.7 10*3/uL (ref 0.1–1.0)
Monocytes Relative: 9.4 % (ref 3.0–12.0)
NEUTROS ABS: 5 10*3/uL (ref 1.4–7.7)
NEUTROS PCT: 62.9 % (ref 43.0–77.0)
PLATELETS: 293 10*3/uL (ref 150.0–400.0)
RBC: 4.81 Mil/uL (ref 3.87–5.11)
RDW: 13.5 % (ref 11.5–15.5)
WBC: 7.9 10*3/uL (ref 4.0–10.5)

## 2015-05-04 MED ORDER — NA SULFATE-K SULFATE-MG SULF 17.5-3.13-1.6 GM/177ML PO SOLN: 1.0000 | ORAL | Status: DC

## 2015-05-04 MED ORDER — HYDROCORTISONE ACETATE 25 MG RE SUPP: 25.0000 mg | Freq: Two times a day (BID) | RECTAL | Status: DC

## 2015-05-04 NOTE — Patient Instructions (Signed)
Your physician has requested that you go to the basement for lab work before leaving today.  You have been scheduled for a colonoscopy. Please follow written instructions given to you at your visit today.  Please pick up your prep supplies at the pharmacy within the next 1-3 days. If you use inhalers (even only as needed), please bring them with you on the day of your procedure. Your physician has requested that you go to www.startemmi.com and enter the access code given to you at your visit today. This web site gives a general overview about your procedure. However, you should still follow specific instructions given to you by our office regarding your preparation for the procedure.  We have sent the following medications to your pharmacy for you to pick up at your convenience: Anusol Suppositories one per rectum twice a day for 10 days.

## 2015-05-06 ENCOUNTER — Encounter: Payer: Self-pay | Admitting: Physician Assistant

## 2015-05-06 NOTE — Progress Notes (Signed)
Patient ID: Margaret Richardson, female   DOB: May 09, 1955, 60 y.o.   MRN: [id number]    HPI:  Margaret Richardson is a 60 y.o.   female referred by Laverna Peace, NP  For evaluation of rectal bleeding. Margaret Richardson has previously been followed by Dr. Delfin Edis for chronic rectal pain. She works as a Oceanographer. She has a history of a vaginal hysterectomy and anterior and posterior repair by Dr. Gaetano Net in the past. 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 Prince Solian.  Subsequent colonoscopy in 2008 showed a patent anastomosis. A CT of the abdomen in November 2008 showed sigmoid thickening.    she is here today with recurrent complaints of rectal bleeding. She states she is scheduled for a revision of a vaginal repair in the near future. Over the past several months she's been having rectal bleeding with blood on the toilet tissue. Over the past month however she says it is been a lot of blood on the toilet tissue. She states her rectum always feels like it is swollen and she has pain when she is sitting. She does not strain to have a bowel movement , and says her stools are not hard. Her appetite has been good, and her weight is been stable. She states she was advised that she would need surveillance colonoscopy in 2018 , but with her upcoming surgery she would like to be sure there is nothing more serious causing the bleeding and would like her colonoscopy scheduled soon.   Past Medical History  Diagnosis Date  . Migraine   . Uterine prolapse   . Depression   . GERD (gastroesophageal reflux disease)   . Colovaginal fistula   . Diverticulosis     Past Surgical History  Procedure Laterality Date  . Sigmoid resection / rectopexy    . Colovaginal fistula repair    . Abdominal hysterectomy  1998  . Breast enhancement surgery  1997  . Appendectomy    . Hernia repair    . Tubal ligation    . Back surgery      lumbar disc    . Tonsillectomy    . Anterior and posterior vaginal repair     Family History  Problem Relation Age of Onset  . Colon cancer Neg Hx   . Diabetes Maternal Grandmother    Social History  Substance Use Topics  . Smoking status: Never Smoker   . Smokeless tobacco: Never Used  . Alcohol Use: No   Current Outpatient Prescriptions  Medication Sig Dispense Refill  . hydrocortisone (ANUSOL-HC) 25 MG suppository Place 1 suppository (25 mg total) rectally 2 (two) times daily. For 10 days. 20 suppository 1  . Na Sulfate-K Sulfate-Mg Sulf SOLN Take 1 kit by mouth as directed. 354 mL 0   No current facility-administered medications for this visit.   Allergies  Allergen Reactions  . Codeine Nausea Only    Pt states she can take with promethazine     Review of Systems: Gen: Denies any fever, chills, sweats, anorexia, fatigue, weakness, malaise, weight loss, and sleep disorder CV: Denies chest pain, angina, palpitations, syncope, orthopnea, PND, peripheral edema, and claudication. Resp: Denies dyspnea at rest, dyspnea with exercise, cough, sputum, wheezing, coughing up blood, and pleurisy. GI: Denies vomiting blood, jaundice, and fecal incontinence.   Denies dysphagia or odynophagia. GU : Denies urinary burning, blood in urine, urinary frequency, urinary hesitancy, nocturnal urination, and urinary incontinence.  MS: Denies joint pain, limitation of movement, and swelling, stiffness, low back pain, extremity pain. Denies muscle weakness, cramps, atrophy.  Derm: Denies rash, itching, dry skin, hives, moles, warts, or unhealing ulcers.  Psych: Denies depression, anxiety, memory loss, suicidal ideation, hallucinations, paranoia, and confusion. Heme: Denies bruising, bleeding, and enlarged lymph nodes. Neuro:  Denies any headaches, dizziness, paresthesias. Endo:  Denies any problems with DM, thyroid, adrenal function    Physical Exam: BP 104/64 mmHg  Pulse 83  Ht _0  (1.575 m)  Wt 139 lb  (63.05 kg)  BMI 25.42 kg/m2 Constitutional: Pleasant,well-developed, female in no acute distress. HEENT: Normocephalic and atraumatic. Conjunctivae are normal. No scleral icterus. Neck supple.  Cardiovascular: Normal rate, regular rhythm.  Pulmonary/chest: Effort normal and breath sounds normal. No wheezing, rales or rhonchi. Abdominal: Soft, nondistended, nontender. Bowel sounds active throughout. There are no masses palpable. No hepatomegaly.  rectal: small external hemorrhoids noted. Anoscopy reveals internal hemorrhoids as well. Extremities: no edema Lymphadenopathy: No cervical adenopathy noted. Neurological: Alert and oriented to person place and time. Skin: Skin is warm and dry. No rashes noted. Psychiatric: Normal mood and affect. Behavior is normal.  ASSESSMENT AND PLAN:  60 year old female status post sigmoid resection in the remote past due to diverticular abscess and colovaginal fistula, presenting with recurrent rectal bleeding. Bleeding is likely hemorrhoidal in nature. Patient is scheduled to undergo a revision of her vaginal repair in the near future and would like to have a colonoscopy before proceeding with the surgery. She will be scheduled for colonoscopy to screen for polyps, neoplasia, or other intraluminal pathology.The risks, benefits, and alternatives to colonoscopy with possible biopsy and possible polypectomy were discussed with the patient and they consent to proceed.  The procedure will be scheduled with Dr. Silverio Decamp. In the meantime, for her hemorrhoids she will try Anusol [facility] suppositories 1 per rectum twice daily for 10 days. Further recommendations will be made pending the findings of her colonoscopy. A CBC will be obtained today as well.    Vaughn Beaumier, Deloris Ping 05/06/2015, 7:53 PM  CC: Laverna Peace, NP

## 2015-05-07 ENCOUNTER — Encounter: Payer: Self-pay | Admitting: *Deleted

## 2015-05-07 NOTE — Progress Notes (Signed)
Reviewed and agree with documentation and assessment and plan. K. Veena [place] Grosser , MD   

## 2015-05-10 ENCOUNTER — Encounter: Payer: Self-pay | Admitting: Gastroenterology

## 2015-05-10 ENCOUNTER — Ambulatory Visit (AMBULATORY_SURGERY_CENTER): Payer: PRIVATE HEALTH INSURANCE | Admitting: Gastroenterology

## 2015-05-10 VITALS — BP 121/78 | HR 71 | Temp 96.6°F | Resp 20 | Ht 62.0 in | Wt 139.0 lb

## 2015-05-10 DIAGNOSIS — K625 Hemorrhage of anus and rectum: Secondary | ICD-10-CM

## 2015-05-10 DIAGNOSIS — K5730 Diverticulosis of large intestine without perforation or abscess without bleeding: Secondary | ICD-10-CM | POA: Diagnosis not present

## 2015-05-10 MED ORDER — SODIUM CHLORIDE 0.9 % IV SOLN
500.0000 mL | INTRAVENOUS | Status: DC
Start: 2015-05-10 — End: 2015-05-10

## 2015-05-10 NOTE — Patient Instructions (Signed)
YOU HAD AN ENDOSCOPIC PROCEDURE TODAY AT [facility]:   Refer to the procedure report that was given to you for any specific questions about what was found during the examination.  If the procedure report does not answer your questions, please call your gastroenterologist to clarify.  If you requested that your care partner not be given the details of your procedure findings, then the procedure report has been included in a sealed envelope for you to review at your convenience later.  YOU SHOULD EXPECT: Some feelings of bloating in the abdomen. Passage of more gas than usual.  Walking can help get rid of the air that was put into your GI tract during the procedure and reduce the bloating. If you had a lower endoscopy (such as a colonoscopy or flexible sigmoidoscopy) you may notice spotting of blood in your stool or on the toilet paper. If you underwent a bowel prep for your procedure, you may not have a normal bowel movement for a few days.  Please Note:  You might notice some irritation and congestion in your nose or some drainage.  This is from the oxygen used during your procedure.  There is no need for concern and it should clear up in a day or so.  SYMPTOMS TO REPORT IMMEDIATELY:   Following lower endoscopy (colonoscopy or flexible sigmoidoscopy):  Excessive amounts of blood in the stool  Significant tenderness or worsening of abdominal pains  Swelling of the abdomen that is new, acute  Fever of 100F or higher   For urgent or emergent issues, a gastroenterologist can be reached at any hour by calling [phone].   DIET: Your first meal following the procedure should be a small meal and then it is ok to progress to your normal diet. Heavy or fried foods are harder to digest and may make you feel nauseous or bloated.  Likewise, meals heavy in dairy and vegetables can increase bloating.  Drink plenty of fluids but you should avoid alcoholic beverages for 24  hours.  ACTIVITY:  You should plan to take it easy for the rest of today and you should NOT DRIVE or use heavy machinery until tomorrow (because of the sedation medicines used during the test).    FOLLOW UP: Our staff will call the number listed on your records the next business day following your procedure to check on you and address any questions or concerns that you may have regarding the information given to you following your procedure. If we do not reach you, we will leave a message.  However, if you are feeling well and you are not experiencing any problems, there is no need to return our call.  We will assume that you have returned to your regular daily activities without incident.  If any biopsies were taken you will be contacted by phone or by letter within the next 1-3 weeks.  Please call us at [phone] if you have not heard about the biopsies in 3 weeks.    SIGNATURES/CONFIDENTIALITY: You and/or your care partner have signed paperwork which will be entered into your electronic medical record.  These signatures attest to the fact that that the information above on your After Visit Summary has been reviewed and is understood.  Full responsibility of the confidentiality of this discharge information lies with you and/or your care-partner.  Diverticulosis/hemorrhoids handout given

## 2015-05-10 NOTE — Op Note (Signed)
[place]  [address] [place], [address]   COLONOSCOPY PROCEDURE REPORT  PATIENT: Margaret Richardson, Margaret Richardson  MR#: [id number] BIRTHDATE: Jul 27, 1955 , 51  yrs. old GENDER: female ENDOSCOPIST: Harl Bowie, MD REFERRED BY:Amy Sylvania NP PROCEDURE DATE:  05/10/2015 PROCEDURE:   Colonoscopy, screening First Screening Colonoscopy - Avg.  risk and is 50 yrs.  old or older - No.  Prior Negative Screening - Now for repeat screening. 10 or more years since last screening  History of Adenoma - Now for follow-up colonoscopy & has been > or = to 3 yrs.  N/A  Polyps removed today? No Recommend repeat exam, <10 yrs? No ASA CLASS:   Class II INDICATIONS:Evaluation of unexplained GI bleeding, Screening for colonic neoplasia, and Colorectal Neoplasm Risk Assessment for this procedure is average risk. MEDICATIONS: Propofol 200 mg IV  DESCRIPTION OF PROCEDURE:   After the risks benefits and alternatives of the procedure were thoroughly explained, informed consent was obtained.  The digital rectal exam revealed no abnormalities of the rectum.   The [id number]  endoscope was introduced through the anus and advanced to the cecum, which was identified by both the appendix and ileocecal valve. No adverse events experienced.   The quality of the prep was good.  The instrument was then slowly withdrawn as the colon was fully examined. Estimated blood loss is zero unless otherwise noted in this procedure report.   COLON FINDINGS: There was moderate diverticulosis noted throughout the entire examined colon. anastomosis site in sigmoid colon appears normal  Small internal hemorrhoids were found.  Retroflexed views revealed internal hemorrhoids. The time to cecum = 1.6 Withdrawal time = 10.9   The scope was withdrawn and the procedure completed. COMPLICATIONS: There were no immediate complications.  ENDOSCOPIC IMPRESSION: 1.   Moderate diverticulosis was noted throughout the  entire examined colon 2.   Small internal hemorrhoids  RECOMMENDATIONS: You should continue to follow colorectal cancer screening guidelines for "routine risk" patients with a repeat colonoscopy in 10 years. There is no need for FOBT (stool) testing for at least 5 years.  eSigned:  Harl Bowie, MD 05/10/2015 3:12 PM

## 2015-05-10 NOTE — Progress Notes (Signed)
Report to PACU, RN, vss, BBS= Clear.  

## 2015-05-11 ENCOUNTER — Telehealth: Payer: Self-pay | Admitting: *Deleted

## 2015-05-11 NOTE — Telephone Encounter (Signed)
No answer, message left for the patient. 

## 2015-08-10 HISTORY — PX: VAGINA RECONSTRUCTION SURGERY: SHX828

## 2016-02-27 ENCOUNTER — Ambulatory Visit (INDEPENDENT_AMBULATORY_CARE_PROVIDER_SITE_OTHER): Payer: Worker's Compensation | Admitting: Orthopedic Surgery

## 2016-05-29 ENCOUNTER — Other Ambulatory Visit: Payer: Self-pay

## 2016-05-29 ENCOUNTER — Telehealth: Payer: Self-pay | Admitting: Gastroenterology

## 2016-05-29 DIAGNOSIS — A09 Infectious gastroenteritis and colitis, unspecified: Secondary | ICD-10-CM

## 2016-05-29 NOTE — Telephone Encounter (Signed)
Patient agrees to this plan of care.  

## 2016-05-29 NOTE — Telephone Encounter (Signed)
Please advise patient to do GI pathogen panel with C.diff, BMP and CBC. Maintain hydration. Small frequent meals.

## 2016-05-29 NOTE — Telephone Encounter (Signed)
10 plus days of watery diarrhea with urgency, loss of appetite and a generalized feeling of not being well. Denies bloody bowel movements or nausea. Sore feeling lower abdomen. No recent atb's. No APP openings until mid next week. Please advise.

## 2016-06-06 ENCOUNTER — Telehealth: Payer: Self-pay | Admitting: Gastroenterology

## 2016-06-06 NOTE — Telephone Encounter (Signed)
I had spoken with the patient on 05/29/16. She had the recommended and ordered tests done at her PCP. She was seen there also. Tests normal per patient. No cause for diarrhea found. It was advised she come to see her GI. She continues to have diarrhea. She is not taking any imodium. She will wait until she is seen and advised on this. Appointment made for 06/11/16 with Nicoletta Ba, PA-C

## 2016-06-10 ENCOUNTER — Telehealth: Payer: Self-pay | Admitting: Gastroenterology

## 2016-06-10 NOTE — Telephone Encounter (Signed)
Rec'd from [facility] forward 8 pages to Dr. Harl Bowie

## 2016-06-11 ENCOUNTER — Other Ambulatory Visit (INDEPENDENT_AMBULATORY_CARE_PROVIDER_SITE_OTHER): Payer: BLUE CROSS/BLUE SHIELD

## 2016-06-11 ENCOUNTER — Encounter: Payer: Self-pay | Admitting: Physician Assistant

## 2016-06-11 ENCOUNTER — Encounter: Payer: Self-pay | Admitting: *Deleted

## 2016-06-11 ENCOUNTER — Ambulatory Visit (INDEPENDENT_AMBULATORY_CARE_PROVIDER_SITE_OTHER): Payer: BLUE CROSS/BLUE SHIELD | Admitting: Physician Assistant

## 2016-06-11 VITALS — BP 102/68 | HR 80 | Ht 61.0 in | Wt 131.1 lb

## 2016-06-11 DIAGNOSIS — R197 Diarrhea, unspecified: Secondary | ICD-10-CM | POA: Diagnosis not present

## 2016-06-11 DIAGNOSIS — R109 Unspecified abdominal pain: Secondary | ICD-10-CM

## 2016-06-11 DIAGNOSIS — Z9049 Acquired absence of other specified parts of digestive tract: Secondary | ICD-10-CM | POA: Diagnosis not present

## 2016-06-11 LAB — BUN: BUN: 16 mg/dL (ref 6–23)

## 2016-06-11 LAB — CREATININE, SERUM: Creatinine, Ser: 0.82 mg/dL (ref 0.40–1.20)

## 2016-06-11 MED ORDER — HYOSCYAMINE SULFATE 0.125 MG PO TABS: ORAL_TABLET | ORAL | 2 refills | Status: DC

## 2016-06-11 MED ORDER — METRONIDAZOLE 250 MG PO TABS: 250.0000 mg | ORAL_TABLET | Freq: Four times a day (QID) | ORAL | 0 refills | Status: DC

## 2016-06-11 MED ORDER — HYOSCYAMINE SULFATE 0.125 MG PO TABS: ORAL_TABLET | ORAL | 2 refills | Status: AC

## 2016-06-11 NOTE — Progress Notes (Signed)
Subjective:    Patient ID: Margaret Richardson, female    DOB: 04-21-1956, 61 y.o.   MRN: [id number]  HPI Margaret Richardson is a pleasant 61 year old white female known to Dr. Silverio Decamp who was last seen in our office in January 2017 with complaints of rectal bleeding. He underwent colonoscopy on 05/10/2015 with finding of moderate diverticulosis throughout the colon and an anastomosis in the sigmoid colon which was patent, and small internal hemorrhoids. Patient has history of complicated diverticular disease with perforation and then development of a colovaginal fistula, for which she underwent sigmoid colectomy and fistula repair at separate settings. [facility] She comes in today with complaints of persistent watery diarrhea over the past 3 weeks. She is having at least 5-6 watery bowel movements per day even if she's not eating an more if she eats. She is frequently waking up in the middle of the night with diarrhea. She has also had so's of incontinence since onset of this diarrhea. She has not noticed any rectal bleeding but has had an increase in mucus and said the stool is odorous. Is not had any fever or chills, her weight is down about 4 pounds since onset of her symptoms because she has less diarrhea if she doesn't eat. He has developed abdominal cramping after eating which she says at times can be bad. She called here and was advised to have stool cultures done which she actually did with her PCP in [place] [facility]. We obtain copies of the stool studies which included stool for salmonella Shigella and Campylobacter all of which were negative and a stool C. difficile toxin a and B was done and negative on 06/05/2016. She has not had any recent antibiotics. She has recently started Cymbalta and Neurontin for a pudendal nerve entrapment which is been quite painful for her. She said the diarrhea predated starting those medications. 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 She is currently being treated by a physician at [facility] who specializes in pelvic floor dysfunction and pudendal neuropathy.  Review of Systems Pertinent positive and negative review of systems were noted in the above HPI section.  All other review of systems was otherwise negative.  Outpatient Encounter Prescriptions as of 06/11/2016  Medication Sig  . clonazePAM (KLONOPIN) 1 MG tablet TAKE 1 TABLET BY MOUTH NIGHTLY AS NEEDED FOR ANXIETY  . DULoxetine (CYMBALTA) 60 MG capsule Take 60 mg by mouth.  . gabapentin (NEURONTIN) 300 MG capsule 3 (three) times daily.  . traMADol (ULTRAM) 50 MG tablet Take 50 mg by mouth as needed.  . hyoscyamine (LEVSIN, ANASPAZ) 0.125 MG tablet Take one tablet 1/2 hour before meals  . metroNIDAZOLE (FLAGYL) 250 MG tablet Take 1 tablet (250 mg total) by mouth 4 (four) times daily.  . [DISCONTINUED] hydrocortisone (ANUSOL-HC) 25 MG suppository Place 1 suppository (25 mg total) rectally 2 (two) times daily. For 10 days.  . [DISCONTINUED] hyoscyamine (LEVSIN, ANASPAZ) 0.125 MG tablet One by mouth 1/2 hour before meals  . [DISCONTINUED] metroNIDAZOLE (FLAGYL) 250 MG tablet Take 1 tablet (250 mg total) by mouth 4 (four) times daily.   No facility-administered encounter medications on file as of 06/11/2016.    Allergies  Allergen Reactions  . Codeine Nausea Only    Pt states she can take with promethazine   Patient Active Problem List   Diagnosis Date Noted  . Chest pain  09/08/2012  . Epigastric pain 09/08/2012  . Elevated lipase 09/08/2012  . FATIGUE 05/29/2009  . ANXIETY, SITUATIONAL 01/24/2008  . DIVERTICULOSIS OF COLON 01/24/2008  . COLOVAGINAL FISTULA 01/24/2008  . PROLAPSE, RECTAL 12/22/2007  . CELLULITIS AND ABSCESS OF UPPER ARM AND FOREARM 12/22/2007  . COCCYGEAL PAIN 12/22/2007  . MIGRAINE HEADACHE 06/14/2007  . GERD  06/14/2007   Social History   Social History  . Marital status: Married    Spouse name: N/A  . Number of children: 3  . Years of education: N/A   Occupational History  . nurse    Social History Main Topics  . Smoking status: Never Smoker  . Smokeless tobacco: Never Used  . Alcohol use No  . Drug use: No  . Sexual activity: Yes   Other Topics Concern  . Not on file   Social History Narrative  . No narrative on file    Margaret Richardson's family history includes Breast cancer in her daughter and maternal aunt; Diabetes in her maternal grandmother and mother; Esophageal cancer in her father; Lung cancer in her paternal aunt.      Objective:    Vitals:   06/11/16 1448  BP: 102/68  Pulse: 80    Physical Exam ;Well-developed white female in no acute distress blood pressure 102/68 pulse 80, weight 131, BMI 24.7. HEENT; nontraumatic normocephalic EOMI PERRLA sclera anicteric, Cardiovascular; regular rate and rhythm with S1-S2 no murmur or gallop, Pulmonary; clear bilaterally, Abdomen; soft, no focal tenderness no guarding or rebound bowel sounds are hyperactive, she has midline incisional scar, Rectal; exam not done, Ext; no clubbing cyanosis or edema skin warm and dry, Neuropsych; mood and affect appropriate       Assessment & Plan:   #102  61 year old female with 3-3-1/2 week history of persistent watery diarrhea and abdominal cramping. Recent stool studies negative. Still feel this most likely  is infectious and am still concerned about C. Difficile Doubt bowel obstruction, though patient has concerns because of prior sigmoid resection #2 diverticulosis #3 severe pudendal neuropathy #4 history of complicated diverticulitis with perforation and colovaginal fistula  Plan; Will schedule for CT of the abdomen and pelvis with contrast Check stool for C. difficile by PCR Start empiric course of metronidazole 250 mg by mouth 4 times a day 14 days Start Levsin 1 by mouth 30  minutes before meals Low residue diet Further plans pending results of above  Amy S Esterwood PA-C 06/11/2016   Cc: Moon, Amy A, NP

## 2016-06-11 NOTE — Patient Instructions (Addendum)
If you are age 61 or older, your body mass index should be between 23-30. Your Body mass index is 24.78 kg/m. If this is out of the aforementioned range listed, please consider follow up with your Primary Care Provider.  If you are age 64 or younger, your body mass index should be between 19-25. Your Body mass index is 24.78 kg/m. If this is out of the aformentioned range listed, please consider follow up with your Primary Care Provider.   You have been scheduled for a CT scan of the abdomen and pelvis at [facility] ([address]---this is in the same building as Press [facility]).   You are scheduled on 06/13/16 at 100 pm. You should arrive 15 minutes prior to your appointment time for registration. Please follow the written instructions below on the day of your exam:  WARNING: IF YOU ARE ALLERGIC TO IODINE/X-RAY DYE, PLEASE NOTIFY RADIOLOGY IMMEDIATELY AT [phone]! YOU WILL BE GIVEN A 13 HOUR PREMEDICATION PREP.  1) Do not eat or drink anything after 10 a.m.(4 hours prior to your test) 2) You have been given 2 bottles of oral contrast to drink. The solution may taste               better if refrigerated, but do NOT add ice or any other liquid to this solution. Shake             well before drinking.    Drink 1 bottle of contrast @ 1100 am(2 hours prior to your exam)  Drink 1 bottle of contrast @ 1200/ noon (1 hour prior to your exam)  You may take any medications as prescribed with a small amount of water except for the following: Metformin, Glucophage, Glucovance, Avandamet, Riomet, Fortamet, Actoplus Met, Janumet, Glumetza or Metaglip. The above medications must be held the day of the exam AND 48 hours after the exam.  The purpose of you drinking the oral contrast is to aid in the visualization of your intestinal tract. The contrast solution may cause some diarrhea. Before your exam is started, you will be given a small amount of fluid to drink. Depending on your  individual set of symptoms, you may also receive an intravenous injection of x-ray contrast/dye. Plan on being at [facility] for 30 minutes or longer, depending on the type of exam you are having performed.  This test typically takes 30-45 minutes to complete.  If you have any questions regarding your exam or if you need to reschedule, you may call the CT department at [phone] between the hours of 8:00 am and 5:00 pm, Monday-Friday.  We have sent the following medications to your pharmacy for you to pick up at your convenience: Levsin Flagyl  Low fiber diet given.  Your physician has requested that you go to the basement for lab work before leaving today.  Thank you for choosing me and [facility].  Nicoletta Ba, PA

## 2016-06-12 ENCOUNTER — Telehealth: Payer: Self-pay | Admitting: Physician Assistant

## 2016-06-12 ENCOUNTER — Telehealth: Payer: Self-pay | Admitting: Gastroenterology

## 2016-06-12 NOTE — Telephone Encounter (Signed)
Spoke with the patient. She would like the CT scheduled for after 06/24/16. She will turn in the stool specimen first.

## 2016-06-12 NOTE — Telephone Encounter (Signed)
Rec'd from [facility] forward 8 pages to Dr.Nandigam [facility]

## 2016-06-13 ENCOUNTER — Other Ambulatory Visit: Payer: BLUE CROSS/BLUE SHIELD

## 2016-06-13 ENCOUNTER — Inpatient Hospital Stay: Admission: RE | Admit: 2016-06-13 | Payer: BLUE CROSS/BLUE SHIELD | Source: Ambulatory Visit

## 2016-06-13 DIAGNOSIS — R197 Diarrhea, unspecified: Secondary | ICD-10-CM

## 2016-06-13 DIAGNOSIS — Z9049 Acquired absence of other specified parts of digestive tract: Secondary | ICD-10-CM

## 2016-06-13 DIAGNOSIS — R109 Unspecified abdominal pain: Secondary | ICD-10-CM

## 2016-06-13 NOTE — Telephone Encounter (Signed)
Left message on the voicemail of [facility] scheduler. Waiting for her to return the call.

## 2016-06-14 LAB — CLOSTRIDIUM DIFFICILE BY PCR: Toxigenic C. Difficile by PCR: NOT DETECTED

## 2016-06-16 NOTE — Progress Notes (Signed)
Reviewed and agree with documentation and assessment and plan. K. Veena Nandigam , MD   

## 2016-06-17 NOTE — Telephone Encounter (Signed)
No call back from [facility] yet. Orders and patient information faxed to [facility] referral line.

## 2016-06-20 ENCOUNTER — Other Ambulatory Visit: Payer: BLUE CROSS/BLUE SHIELD

## 2016-06-26 NOTE — Telephone Encounter (Signed)
Left a message for the patient to call about the CT

## 2016-07-08 ENCOUNTER — Other Ambulatory Visit: Payer: Self-pay

## 2016-07-08 ENCOUNTER — Telehealth: Payer: Self-pay | Admitting: Physician Assistant

## 2016-07-08 DIAGNOSIS — R197 Diarrhea, unspecified: Secondary | ICD-10-CM

## 2016-07-08 DIAGNOSIS — R109 Unspecified abdominal pain: Secondary | ICD-10-CM

## 2016-07-08 DIAGNOSIS — Z9049 Acquired absence of other specified parts of digestive tract: Secondary | ICD-10-CM

## 2016-07-08 NOTE — Telephone Encounter (Signed)
Advised the patient that I have called [facility] and I have faxed the orders but did not get a response or return call. I have also tried to call her and no response.  Per her request because she wants to get the CT with [facility], I have called again. Left another voicemail for the ancillary coordinator after speaking with a nurse.

## 2016-07-08 NOTE — Telephone Encounter (Signed)
Spoke with [employer]. Insurance authorization and order with office notes faxed to her. See the referral authorization for details. The coordinator said she would contact the patient directly to schedule.

## 2016-07-23 ENCOUNTER — Telehealth: Payer: Self-pay | Admitting: Gastroenterology

## 2016-07-24 NOTE — Telephone Encounter (Signed)
Have you seen any CT results from [facility]?

## 2016-07-29 NOTE — Telephone Encounter (Signed)
Beth I have checked every where for CT scan results.. I still have not seen them,

## 2016-07-30 NOTE — Telephone Encounter (Signed)
Left detailed voice message at [place] [phone] option 4 then option 3) Requested CT report. Left a fax number and a call back number.

## 2016-07-31 NOTE — Telephone Encounter (Signed)
Report obtained. Given to the provider.

## 2017-02-06 ENCOUNTER — Other Ambulatory Visit: Payer: Self-pay | Admitting: Orthopedic Surgery

## 2017-02-06 DIAGNOSIS — M79671 Pain in right foot: Secondary | ICD-10-CM

## 2017-02-09 ENCOUNTER — Ambulatory Visit
Admission: RE | Admit: 2017-02-09 | Discharge: 2017-02-09 | Disposition: A | Discharge status: Home / Self Care | Payer: BLUE CROSS/BLUE SHIELD | Source: Ambulatory Visit | Attending: Orthopedic Surgery | Admitting: Orthopedic Surgery

## 2017-02-09 DIAGNOSIS — M79671 Pain in right foot: Secondary | ICD-10-CM

## 2017-02-09 MED ORDER — IOPAMIDOL (ISOVUE-300) INJECTION 61%
100.0000 mL | Freq: Once | INTRAVENOUS | Status: AC | PRN
  Administered 2017-02-09: 100 mL via INTRAVENOUS

## 2017-05-27 ENCOUNTER — Ambulatory Visit: Payer: BLUE CROSS/BLUE SHIELD | Admitting: Physician Assistant

## 2017-05-27 ENCOUNTER — Telehealth: Payer: Self-pay | Admitting: Gastroenterology

## 2017-05-27 NOTE — Telephone Encounter (Signed)
Spoke with the patient. She states she is having discomfort at her rectum and some bleeding. She can feel a bulging when she cleans. She also feels the bulging becoming uncomfortable when she has been on her feet or walking for any length of time. States she has had vaginal and colon surgeries. She has an INTERSTIM implant for her pelvic floor pain. Sees a GYN in [place]. She denies constipation. She takes daily Colace and has daily bowel movements. Appointment made for her with the understanding it is not a guarantee she will have banding done on that day.

## 2017-06-15 ENCOUNTER — Encounter: Payer: Self-pay | Admitting: Gastroenterology

## 2017-06-15 ENCOUNTER — Ambulatory Visit: Payer: BLUE CROSS/BLUE SHIELD | Admitting: Gastroenterology

## 2017-06-15 VITALS — BP 106/68 | HR 74 | Ht 62.0 in | Wt 142.0 lb

## 2017-06-15 DIAGNOSIS — R198 Other specified symptoms and signs involving the digestive system and abdomen: Secondary | ICD-10-CM

## 2017-06-15 DIAGNOSIS — K641 Second degree hemorrhoids: Secondary | ICD-10-CM | POA: Diagnosis not present

## 2017-06-15 DIAGNOSIS — K5902 Outlet dysfunction constipation: Secondary | ICD-10-CM

## 2017-06-15 DIAGNOSIS — K625 Hemorrhage of anus and rectum: Secondary | ICD-10-CM

## 2017-06-15 NOTE — Patient Instructions (Signed)
We are referring you for Physical Therapy and they will contact you with that appointment  If you are age 62 or older, your body mass index should be between 23-30. Your Body mass index is 25.97 kg/m. If this is out of the aforementioned range listed, please consider follow up with your Primary Care Provider.  If you are age 53 or younger, your body mass index should be between 19-25. Your Body mass index is 25.97 kg/m. If this is out of the aformentioned range listed, please consider follow up with your Primary Care Provider.

## 2017-06-16 ENCOUNTER — Ambulatory Visit: Payer: BLUE CROSS/BLUE SHIELD | Attending: Gastroenterology | Admitting: Physical Therapy

## 2017-06-16 ENCOUNTER — Other Ambulatory Visit: Payer: Self-pay

## 2017-06-16 ENCOUNTER — Encounter: Payer: Self-pay | Admitting: Physical Therapy

## 2017-06-16 DIAGNOSIS — R279 Unspecified lack of coordination: Secondary | ICD-10-CM

## 2017-06-16 DIAGNOSIS — M62838 Other muscle spasm: Secondary | ICD-10-CM

## 2017-06-16 DIAGNOSIS — M6281 Muscle weakness (generalized): Secondary | ICD-10-CM | POA: Insufficient documentation

## 2017-06-16 NOTE — Therapy (Signed)
[facility] [address], [place], [place], [address] Phone: [phone]   Fax:  [phone]  Physical Therapy Evaluation  Patient Details  Name: Margaret Richardson MRN: [id number] Date of Birth: 05-08-1955 Referring Provider: Dr. Harl Bowie   Encounter Date: 06/16/2017  PT End of Session - 06/16/17 1235    Visit Number  1    Date for PT Re-Evaluation  08/11/17    Authorization Type  [employer]    Authorization - Visit Number  1    Authorization - Number of Visits  30    PT Start Time  8786    PT Stop Time  1315    PT Time Calculation (min)  45 min    Activity Tolerance  Patient tolerated treatment well    Behavior During Therapy  [facility] for tasks assessed/performed       Past Medical History:  Diagnosis Date  . Colovaginal fistula   . Depression   . Diverticulosis   . GERD (gastroesophageal reflux disease)   . High-tone pelvic floor dysfunction   . Migraine   . Pudendal neuralgia   . Uterine prolapse     Past Surgical History:  Procedure Laterality Date  . ABDOMINAL HYSTERECTOMY  1998  . ANTERIOR AND POSTERIOR VAGINAL REPAIR    . APPENDECTOMY    . BACK SURGERY     lumbar disc  . BREAST ENHANCEMENT SURGERY  1997  . colovaginal fistula repair    . HERNIA REPAIR    . SIGMOID RESECTION / RECTOPEXY    . TONSILLECTOMY    . TUBAL LIGATION    . VAGINA RECONSTRUCTION SURGERY  08/10/2015    There were no vitals filed for this visit.   Subjective Assessment - 06/16/17 1235    Subjective  Since patient has gotten the interstim, she is having trouble with her bowels.  Patient has difficulty with pushing the stool out.  Has used her fingers to push the stool out.  Patient reports she is not constipated.  She poops golf ball size balls at a time. Patient reports her rectum is pushed out and she has to push it back in.  Prolapse can be reduced.  Patient has several hemmorroids. Patient has a Physiological scientist.      How long can you walk comfortably?  most is 2 hours    Patient Stated Goals  learn how to  have a bowel movement;     Currently in Pain?  Yes    Pain Score  8     Pain Location  Abdomen    Pain Orientation  Mid    Pain Descriptors / Indicators  Cramping    Pain Type  Chronic pain    Pain Onset  More than a month ago    Pain Frequency  Intermittent    Aggravating Factors   not sure    Pain Relieving Factors  heat    Multiple Pain Sites  Yes    Pain Score  8    Pain Location  Rectum    Pain Orientation  Mid    Pain Descriptors / Indicators  Aching;Burning;Throbbing    Pain Type  Chronic pain    Pain Onset  Today    Pain Frequency  Constant    Aggravating Factors   standing, walking period of time, lifting, intercoruse    Pain Relieving Factors  sit to rest         [facility] PT Assessment - 06/16/17 0001  Assessment   Medical Diagnosis  K59.02 constipation Outlet Dysfunction    Referring Provider  Dr. Harl Bowie    Onset Date/Surgical Date  12/14/16    Prior Therapy  none      Precautions   Precautions  Other (comment)    Precaution Comments  interstim       Restrictions   Weight Bearing Restrictions  No      Balance Screen   Has the patient fallen in the past 6 months  No    Has the patient had a decrease in activity level because of a fear of falling?   No    Is the patient reluctant to leave their home because of a fear of falling?   No      Home Film/video editor residence    Living Arrangements  Spouse/significant other      Prior Function   Level of Independence  Independent    Vocation  On disability    Leisure  take care of her hores      Cognition   Overall Cognitive Status  Within Functional Limits for tasks assessed      Posture/Postural Control   Posture/Postural Control  Postural limitations    Posture Comments  sits on left buttocks      ROM / Strength   AROM / PROM / Strength  AROM;PROM;Strength      PROM    Right Hip External Rotation   40    Left Hip External Rotation   40      Strength   Overall Strength Comments  abdominal strength is 2/5    Strength Assessment Site  Knee    Right Hip ABduction  4/5    Left Hip ABduction  4/5    Right Knee Extension  4/5    Left Knee Extension  4/5      Palpation   SI assessment   right ilium is rotated anteriorly    Palpation comment  decreased mobility of lower abdominal scar; tenderness located throughout the abdomen; tenderness located in levator ani and obturator internist      Transfers   Transfers  Not assessed      Ambulation/Gait   Ambulation/Gait  No             Objective measurements completed on examination: See above findings.    Pelvic Floor Special Questions - 06/16/17 0001    Prior Pregnancies  Yes    Number of Pregnancies  3    Number of Vaginal Deliveries  3    Any difficulty with labor and deliveries  Yes 9 pound babies    Currently Sexually Active  Yes    Is this Painful  Yes deep pain    Marinoff Scale  pain prevents any attempts at intercourse    Urinary urgency  No    Fecal incontinence  No 2-4 bowel movements per day    Falling out feeling (prolapse)  Yes    Activities that cause feeling of prolapse  standing and walking, rectal prolapse    Skin Integrity  Intact    Pelvic Floor Internal Exam  Patient confirms identification and approves PT to assess muscles and treat    Exam Type  Rectal    Palpation  tenderness located in obturator internist, levator ani, able to contract the puborectalis and internal anal sphincter but has difficulty with contracting the external sphincter,     Strength  weak squeeze, no lift  Tone  increased tone onleft pelvic floor muscles bu tdecreased on the right               PT Education - 06/16/17 1318    Education provided  Yes    Education Details  abdominal massage    Person(s) Educated  Patient    Methods  Explanation;Demonstration;Handout    Comprehension   Verbalized understanding;Returned demonstration       PT Short Term Goals - 06/16/17 1413      PT SHORT TERM GOAL #1   Title  independent with initial HEP     Time  4    Period  Weeks    Status  New    Target Date  07/14/17      PT SHORT TERM GOAL #2   Title  understand correct toileting to decrease strain on the pelvic floor and reduce prolapse    Time  4    Period  Weeks    Status  New    Target Date  07/14/17      PT SHORT TERM GOAL #3   Title  rectal pain in sitting, standing, and walking decreased >/= 25%    Time  4    Period  Weeks    Status  New    Target Date  07/14/17      PT SHORT TERM GOAL #4   Title  understand how to perform abdominal massage to reduce abdominal pain and improve peristalic motion of intestines    Time  4    Period  Weeks    Status  New    Target Date  07/14/17        PT Long Term Goals - 06/16/17 1417      PT LONG TERM GOAL #1   Title  independent with HEP and understand how to progress herself    Time  8    Period  Weeks    Status  New    Target Date  08/11/17      PT LONG TERM GOAL #2   Title  able to have a bowel movement with greater ease and no prolpase of rectum    Time  8    Period  Weeks    Status  New    Target Date  08/11/17      PT LONG TERM GOAL #3   Title  pain in  standing, walking decreased >/= 60% due to improved pelvic floor strength     Time  8    Period  Weeks    Status  New    Target Date  08/11/17      PT LONG TERM GOAL #4   Title  able to sit on bilateral buttocks with equal weightbear due to reduction in trigger points in the pelvic floor and pain decreased >/= 60%     Time  8    Period  Weeks    Status  New    Target Date  08/11/17             Plan - 06/16/17 1401    Clinical Impression Statement  Patient is a 62 year old female with constipation outlet dysfunction since she had her interstim implanted this year.  Patient has had several pelvic floor surgeries including abdominal  hysterectomy, vaginal reconstruction, hernia repair, urethrap mesh sling for the bladder and colovaginal fistula repair with removing part of the colon.  Patient reports abdominal pain at 8/10 with no activity that  will increase pain.  Rectal pain is constant at level 8/10 with standing and walking.  Patient will sit to rest to reduce pain. Abdominal strength is 2/5, bilateral hip abduction is 4/5 and pelvic floor strength is 2/5.  While patient contracts the pelvic floor the internal sphincter will contract compared to the external sphincter. Palpable tenderness located in the levator ani and obturator internist, abdominal are tender especially lower.  Right ilium is angteriorly rotated.  Patient reports her bowel movements are golf size and she has 2- 6 bowel movements per day and she has to strain. Patient reports her rectum will prolpase after a bowel movement.  Patient will benefit from skilled therapy to  work on synergy of the pelvic floor muscles to be able to expel a bowel movement with greater ease and reduce pelvic pain  with standing and walking.     History and Personal Factors relevant to plan of care:  Abodminal hysterectomy, vagina, reconstruction; colovaginal fistula repair with removal of part of her colon; anterior/posterior vaginal repair 2 times; interstim unit    Clinical Presentation  Evolving    Clinical Presentation due to:  patient has difficulty with sittng, standing, and walking due to pain and difficulty with having a bowel movement    Clinical Decision Making  Moderate    Rehab Potential  Good    Clinical Impairments Affecting Rehab Potential  Abodminal hysterectomy, vagina, reconstruction; colovaginal fistula repair with removal of part of her colon; anterior/posterior vaginal repair 2 times; interstim unit    PT Frequency  2x / week    PT Duration  8 weeks    PT Treatment/Interventions  Biofeedback;Cryotherapy;Moist Heat;Therapeutic activities;Therapeutic  exercise;Patient/family education;Neuromuscular re-education;Manual techniques;Scar mobilization;Dry needling    PT Next Visit Plan  soft tissue work to abdomen and internal; correct pelvis; diaphgramatic breathing; stretches    PT Home Exercise Plan  progress as needed    Consulted and Agree with Plan of Care  Patient       Patient will benefit from skilled therapeutic intervention in order to improve the following deficits and impairments:  Increased fascial restricitons, Pain, Decreased strength, Decreased activity tolerance, Decreased endurance, Decreased scar mobility, Decreased coordination, Decreased mobility  Visit Diagnosis: Muscle weakness (generalized) - Plan: PT plan of care cert/re-cert  Unspecified lack of coordination - Plan: PT plan of care cert/re-cert  Other muscle spasm - Plan: PT plan of care cert/re-cert     Problem List Patient Active Problem List   Diagnosis Date Noted  . Chest pain 09/08/2012  . Epigastric pain 09/08/2012  . Elevated lipase 09/08/2012  . FATIGUE 05/29/2009  . ANXIETY, SITUATIONAL 01/24/2008  . DIVERTICULOSIS OF COLON 01/24/2008  . COLOVAGINAL FISTULA 01/24/2008  . PROLAPSE, RECTAL 12/22/2007  . CELLULITIS AND ABSCESS OF UPPER ARM AND FOREARM 12/22/2007  . COCCYGEAL PAIN 12/22/2007  . MIGRAINE HEADACHE 06/14/2007  . GERD 06/14/2007    Earlie Counts, PT 06/16/17 2:23 PM   [facility] [address] [place], [place], [address] Phone: [phone]   Fax:  [phone]  Name: Margaret Richardson MRN: [id number] Date of Birth: October 13, 1955

## 2017-06-16 NOTE — Patient Instructions (Addendum)
About Abdominal Massage  Abdominal massage, also called external colon massage, is a self-treatment circular massage technique that can reduce and eliminate gas and ease constipation. The colon naturally contracts in waves in a clockwise direction starting from inside the right hip, moving up toward the ribs, across the belly, and down inside the left hip.  When you perform circular abdominal massage, you help stimulate your colon's normal wave pattern of movement called peristalsis.  It is most beneficial when done after eating.  Positioning You can practice abdominal massage with oil while lying down, or in the shower with soap.  Some people find that it is just as effective to do the massage through clothing while sitting or standing.  How to Massage Start by placing your finger tips or knuckles on your right side, just inside your hip bone.  . Make small circular movements while you move upward toward your rib cage.   . Once you reach the bottom right side of your rib cage, take your circular movements across to the left side of the bottom of your rib cage.  . Next, move downward until you reach the inside of your left hip bone.  This is the path your feces travel in your colon. . Continue to perform your abdominal massage in this pattern for 10 minutes each day.     You can apply as much pressure as is comfortable in your massage.  Start gently and build pressure as you continue to practice.  Notice any areas of pain as you massage; areas of slight pain may be relieved as you massage, but if you have areas of significant or intense pain, consult with your healthcare provider.  Other Considerations . General physical activity including bending and stretching can have a beneficial massage-like effect on the colon.  Deep breathing can also stimulate the colon because breathing deeply activates the same nervous system that supplies the colon.   . Abdominal massage should always be used in  combination with a bowel-conscious diet that is high in the proper type of fiber for you, fluids (primarily water), and a regular exercise program. [facility] [address], [address] , [place] [address] Phone # [phone] Fax [phone]  

## 2017-06-18 ENCOUNTER — Ambulatory Visit: Payer: BLUE CROSS/BLUE SHIELD | Admitting: Physical Therapy

## 2017-06-18 ENCOUNTER — Encounter: Payer: Self-pay | Admitting: Physical Therapy

## 2017-06-18 DIAGNOSIS — R279 Unspecified lack of coordination: Secondary | ICD-10-CM

## 2017-06-18 DIAGNOSIS — M6281 Muscle weakness (generalized): Secondary | ICD-10-CM

## 2017-06-18 DIAGNOSIS — M62838 Other muscle spasm: Secondary | ICD-10-CM

## 2017-06-18 NOTE — Progress Notes (Signed)
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  Since patient had InterStim placement fecal incontinence has improved significantly but currently she is having difficulty evacuating and has been straining excessively to defecate, sometimes she has to push for 30-45 minutes to have a bowel movement.  She notices small volume bright red blood after bowel movement when she wipes and also in the toilet sometimes.  She has done pelvic floor physical therapy for fecal incontinence in the past but has not done any since InterStim placement.  Last colonoscopy May 10, 2015, showed pancolonic diverticulosis, evidence of anastomosis and sigmoid colon and small internal hemorrhoids   Outpatient Encounter Medications as of 06/15/2017  Medication Sig  . clonazePAM (KLONOPIN) 1 MG tablet TAKE 1 TABLET BY MOUTH NIGHTLY AS NEEDED FOR ANXIETY  . DULoxetine (CYMBALTA) 60 MG capsule Take 60 mg by mouth.  . gabapentin (NEURONTIN) 300 MG capsule Take by mouth 1 day or 1 dose.   . hyoscyamine (LEVSIN, ANASPAZ) 0.125 MG tablet Take one tablet 1/2 hour before meals (Patient taking differently: as needed. Take one tablet 1/2 hour before meals)  . pregabalin (LYRICA) 50 MG capsule Take 50 mg by mouth 2 (two) times daily.   . [DISCONTINUED] metroNIDAZOLE (FLAGYL) 250 MG tablet Take 1 tablet (250 mg total) by mouth 4 (four) times daily.  . [DISCONTINUED] traMADol (ULTRAM) 50 MG tablet Take 50 mg by mouth as needed.   No  facility-administered encounter medications on file as of 06/15/2017.     Allergies as of 06/15/2017 - Review Complete 06/11/2016  Allergen Reaction Noted  . Codeine Nausea Only 09/08/2012    Past Medical History:  Diagnosis Date  . Colovaginal fistula   . Depression   . Diverticulosis   . GERD (gastroesophageal reflux disease)   . High-tone pelvic floor dysfunction   . Migraine   . Pudendal neuralgia   . Uterine prolapse     Past Surgical History:  Procedure Laterality Date  . ABDOMINAL HYSTERECTOMY  1998  . ANTERIOR AND POSTERIOR VAGINAL REPAIR    . APPENDECTOMY    . BACK SURGERY     lumbar disc  . BREAST ENHANCEMENT SURGERY  1997  . colovaginal fistula repair    . HERNIA REPAIR    . SIGMOID RESECTION / RECTOPEXY    . TONSILLECTOMY    . TUBAL LIGATION    . VAGINA RECONSTRUCTION SURGERY  08/10/2015    Family History  Problem Relation Age of Onset  . Diabetes Maternal Grandmother   . Breast cancer Daughter   . Esophageal cancer Father   . Diabetes Mother   . Lung cancer Paternal Aunt   . Breast cancer Maternal Aunt   . Colon cancer Neg Hx     Social History   Socioeconomic History  . Marital status: Married    Spouse name: Not on file  . Number of children: 3  . Years of education: Not on file  . Highest education level: Not on file  Social Needs  . Financial  resource strain: Not on file  . Food insecurity - worry: Not on file  . Food insecurity - inability: Not on file  . Transportation needs - medical: Not on file  . Transportation needs - non-medical: Not on file  Occupational History  . Occupation: nurse  Tobacco Use  . Smoking status: Never Smoker  . Smokeless tobacco: Never Used  Substance and Sexual Activity  . Alcohol use: No    Alcohol/week: 0.0 oz  . Drug use: No  . Sexual activity: Yes  Other Topics Concern  . Not on file  Social History Narrative  . Not on file      Review of systems: Review of Systems  Constitutional:  Negative for fever and chills.  HENT: Negative.   Eyes: Negative for blurred vision.  Respiratory: Negative for cough, shortness of breath and wheezing.   Cardiovascular: Negative for chest pain and palpitations.  Gastrointestinal: as per HPI Genitourinary: Negative for dysuria, urgency, frequency and hematuria.  Musculoskeletal: Negative for myalgias, back pain and joint pain.  Skin: Negative for itching and rash.  Neurological: Negative for dizziness, tremors, focal weakness, seizures and loss of consciousness.  Endo/Heme/Allergies: Positive for seasonal allergies.  Psychiatric/Behavioral: Negative for depression, suicidal ideas and hallucinations.  All other systems reviewed and are negative.   Physical Exam: Vitals:   06/15/17 1508  BP: 106/68  Pulse: 74   Body mass index is 25.97 kg/m. Gen:      No acute distress HEENT:  EOMI, sclera anicteric Neck:     No masses; no thyromegaly Lungs:    Clear to auscultation bilaterally; normal respiratory effort CV:         Regular rate and rhythm; no murmurs Abd:      + bowel sounds; soft, non-tender; no palpable masses, no distension Ext:    No edema; adequate peripheral perfusion Skin:      Warm and dry; no rash Neuro: alert and oriented x 3 Psych: normal mood and affect Rectal exam: Normal anal sphincter tone, no anal fissure or external hemorrhoids Anoscopy: Small internal hemorrhoids, no active bleeding, normal dentate line, no visible nodules  Data Reviewed:  Reviewed labs, radiology imaging, old records and pertinent past GI work up   Assessment and Plan/Recommendations:  62 year old female with history of complicated diverticulitis, colovaginal fistula, status post sigmoid colectomy, fistula repair, fecal incontinence status post InterStim is here with complaints of difficulty completely evacuating with defecation and intermittent bright red per rectum On rectal exam patient was relaxing anal sphincter on bear down to some  degree but based on her symptoms I 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  She will benefit from biofeedback and pelvic floor physical therapy, will refer to Earlie Counts Increase fluid intake and Benefiber 1 tablespoon 3 times daily We will hold off hemorrhoidal banding ankle after dyssynergy defecation improves as high likelihood for hemorrhoids to recur if she continues to strain excessively with bowel movements Okay to use Preparation H suppositories at bedtime as needed She is up-to-date with colorectal cancer screening, last colonoscopy January 2017 with no polyps.  25 minutes was spent face-to-face with the patient. Greater than 50% of the time used for counseling as well as treatment plan and follow-up. She had multiple questions which were answered to her satisfaction  K. Denzil Magnuson , MD [phone] Mon-Fri 8a-5p [phone] after 5p, weekends, holidays  CC: Moon, Amy A, NP

## 2017-06-18 NOTE — Patient Instructions (Addendum)
Isometric Hold (Hook-Lying)    Lie with hips and knees bent. Slowly inhale, and then exhale. Pull navel toward spine and Hold for 5___ seconds. Continue to breathe in and out during hold. Rest for _5__ seconds. Repeat _10__ times. Do __2_ times a day.   Copyright  VHI. All rights reserved.  Slow Contraction: Gravity Eliminated (Side-Lying)    Lie on left side, hips and knees slightly bent. Slowly squeeze pelvic floor for __5_ seconds. Rest for _5__ seconds. Repeat _10__ times. Do _3__ times a day.   Copyright  VHI. All rights reserved.  [facility] [address], [place], [place] [address] Phone # [phone] Fax [phone]

## 2017-06-18 NOTE — Therapy (Addendum)
[facility] [address], [place], [place], [address] Phone: [phone]   Fax:  [phone]  Physical Therapy Treatment  Patient Details  Name: Margaret Richardson MRN: [id number] Date of Birth: 27-Mar-1956 Referring Provider: Dr. Harl Bowie   Encounter Date: 06/18/2017  PT End of Session - 06/18/17 1607    Visit Number  2    Date for PT Re-Evaluation  08/11/17    Authorization Type  BC[employer]    Authorization - Visit Number  2    Authorization - Number of Visits  30    PT Start Time  1400    PT Stop Time  1445    PT Time Calculation (min)  45 min    Activity Tolerance  Patient tolerated treatment well    Behavior During Therapy  [facility] for tasks assessed/performed       Past Medical History:  Diagnosis Date  . Colovaginal fistula   . Depression   . Diverticulosis   . GERD (gastroesophageal reflux disease)   . High-tone pelvic floor dysfunction   . Migraine   . Pudendal neuralgia   . Uterine prolapse     Past Surgical History:  Procedure Laterality Date  . ABDOMINAL HYSTERECTOMY  1998  . ANTERIOR AND POSTERIOR VAGINAL REPAIR    . APPENDECTOMY    . BACK SURGERY     lumbar disc  . BREAST ENHANCEMENT SURGERY  1997  . colovaginal fistula repair    . HERNIA REPAIR    . SIGMOID RESECTION / RECTOPEXY    . TONSILLECTOMY    . TUBAL LIGATION    . VAGINA RECONSTRUCTION SURGERY  08/10/2015    There were no vitals filed for this visit.  Subjective Assessment - 06/18/17 1404    Subjective  I was emotionally tired after last visit.     How long can you walk comfortably?  most is 2 hours    Patient Stated Goals  learn how to  have a bowel movement;     Currently in Pain?  Yes    Pain Score  8     Pain Location  Abdomen    Pain Orientation  Mid    Pain Descriptors / Indicators  Cramping    Pain Type  Chronic pain    Pain Onset  More than a month ago    Pain Frequency  Intermittent    Aggravating Factors    not sure    Pain Relieving Factors  heat    Multiple Pain Sites  Yes    Pain Score  8    Pain Location  Rectum    Pain Orientation  Mid    Pain Descriptors / Indicators  Aching;Burning;Throbbing    Pain Type  Chronic pain    Pain Onset  Today    Pain Frequency  Constant    Aggravating Factors   standing, walking period of time, lifting, intercourse    Pain Relieving Factors  sit to rest                      [facility] Adult PT Treatment/Exercise - 06/18/17 0001      Manual Therapy   Manual Therapy  Myofascial release;Soft tissue mobilization;Internal Pelvic Floor    Soft tissue mobilization  abdominal muscles; scar massage    Myofascial Release  umbilicus, lower abdominal to go through the 3 planes of fascia    Internal Pelvic Floor  bil. puborectalis, bil. coccygeus, bil. spincter  PT Education - 06/18/17 1444    Education Details  pelvic floor contraction; abdominal bracing    Person(s) Educated  Patient    Methods  Explanation;Demonstration;Verbal cues;Handout    Comprehension  Returned demonstration;Verbalized understanding       PT Short Term Goals - 06/16/17 1413      PT SHORT TERM GOAL #1   Title  independent with initial HEP     Time  4    Period  Weeks    Status  New    Target Date  07/14/17      PT SHORT TERM GOAL #2   Title  understand correct toileting to decrease strain on the pelvic floor and reduce prolapse    Time  4    Period  Weeks    Status  New    Target Date  07/14/17      PT SHORT TERM GOAL #3   Title  rectal pain in sitting, standing, and walking decreased >/= 25%    Time  4    Period  Weeks    Status  New    Target Date  07/14/17      PT SHORT TERM GOAL #4   Title  understand how to perform abdominal massage to reduce abdominal pain and improve peristalic motion of intestines    Time  4    Period  Weeks    Status  New    Target Date  07/14/17        PT Long Term Goals - 06/16/17 1417      PT LONG  TERM GOAL #1   Title  independent with HEP and understand how to progress herself    Time  8    Period  Weeks    Status  New    Target Date  08/11/17      PT LONG TERM GOAL #2   Title  able to have a bowel movement with greater ease and no prolpase of rectum    Time  8    Period  Weeks    Status  New    Target Date  08/11/17      PT LONG TERM GOAL #3   Title  pain in  standing, walking decreased >/= 60% due to improved pelvic floor strength     Time  8    Period  Weeks    Status  New    Target Date  08/11/17      PT LONG TERM GOAL #4   Title  able to sit on bilateral buttocks with equal weightbear due to reduction in trigger points in the pelvic floor and pain decreased >/= 60%     Time  8    Period  Weeks    Status  New    Target Date  08/11/17            Plan - 06/18/17 1406    Clinical Impression Statement  Patient had increased softness of the abdominal muscles and less trigger points after manual work.  Patient was able to contract and lift the pelvic floor after the manual work bu hold 3 seconds.  Patient was able to push the therapist finger out when bearing down without contracting the pelvic floor.  Patient had tightness along the puborectalis muscle and was looser after manual technique. Patient will benefit from skilled therapy to improve muscle coordination and strength to reduce constipation.     Rehab Potential  Good    Clinical  Impairments Affecting Rehab Potential  Abodminal hysterectomy, vagina, reconstruction; colovaginal fistula repair with removal of part of her colon; anterior/posterior vaginal repair 2 times; interstim unit    PT Frequency  2x / week    PT Duration  8 weeks    PT Treatment/Interventions  Biofeedback;Cryotherapy;Moist Heat;Therapeutic activities;Therapeutic exercise;Patient/family education;Neuromuscular re-education;Manual techniques;Scar mobilization;Dry needling    PT Next Visit Plan  correct pelvis; diaphgramatic breathing; stretches     PT Home Exercise Plan  progress as needed    Recommended Other Services  MD signed initial note    Consulted and Agree with Plan of Care  Patient       Patient will benefit from skilled therapeutic intervention in order to improve the following deficits and impairments:  Increased fascial restricitons, Pain, Decreased strength, Decreased activity tolerance, Decreased endurance, Decreased scar mobility, Decreased coordination, Decreased mobility  Visit Diagnosis: Muscle weakness (generalized)  Unspecified lack of coordination  Other muscle spasm     Problem List Patient Active Problem List   Diagnosis Date Noted  . Chest pain 09/08/2012  . Epigastric pain 09/08/2012  . Elevated lipase 09/08/2012  . FATIGUE 05/29/2009  . ANXIETY, SITUATIONAL 01/24/2008  . DIVERTICULOSIS OF COLON 01/24/2008  . COLOVAGINAL FISTULA 01/24/2008  . PROLAPSE, RECTAL 12/22/2007  . CELLULITIS AND ABSCESS OF UPPER ARM AND FOREARM 12/22/2007  . COCCYGEAL PAIN 12/22/2007  . MIGRAINE HEADACHE 06/14/2007  . GERD 06/14/2007    Earlie Counts, PT 06/18/17 4:11 PM   [facility] [address] [place], [place], [address] Phone: [phone]   Fax:  [phone]  Name: EVITA MERIDA MRN: [id number] Date of Birth: March 26, 1956  PHYSICAL THERAPY DISCHARGE SUMMARY  Visits from Start of Care: 2  Current functional level related to goals / functional outcomes: See above.    Remaining deficits: See above.  Unable to reassess patient due to not attending therapy since last visit on 06/18/2017.   Education / Equipment: HEP Plan:                                                    Patient goals were not met. Patient is being discharged due to not returning since the last visit.  Thank you for the referral. Earlie Counts, PT 07/29/17 3:22 PM  ?????

## 2017-06-23 ENCOUNTER — Encounter: Payer: BLUE CROSS/BLUE SHIELD | Admitting: Physical Therapy

## 2017-06-25 ENCOUNTER — Encounter: Payer: BLUE CROSS/BLUE SHIELD | Admitting: Physical Therapy

## 2017-10-29 DIAGNOSIS — G588 Other specified mononeuropathies: Secondary | ICD-10-CM | POA: Diagnosis not present

## 2017-10-29 DIAGNOSIS — G890 Central pain syndrome: Secondary | ICD-10-CM | POA: Diagnosis not present

## 2017-10-29 DIAGNOSIS — N9489 Other specified conditions associated with female genital organs and menstrual cycle: Secondary | ICD-10-CM | POA: Diagnosis not present

## 2017-10-29 DIAGNOSIS — R3915 Urgency of urination: Secondary | ICD-10-CM | POA: Diagnosis not present

## 2017-10-29 DIAGNOSIS — Z6825 Body mass index (BMI) 25.0-25.9, adult: Secondary | ICD-10-CM | POA: Diagnosis not present

## 2017-12-01 DIAGNOSIS — R300 Dysuria: Secondary | ICD-10-CM | POA: Diagnosis not present

## 2017-12-01 DIAGNOSIS — N390 Urinary tract infection, site not specified: Secondary | ICD-10-CM | POA: Diagnosis not present

## 2017-12-07 ENCOUNTER — Other Ambulatory Visit: Payer: Self-pay | Admitting: *Deleted

## 2017-12-07 NOTE — Patient Outreach (Signed)
[facility]) Care Management  12/07/2017  [name] [birth year] [id number]  [facility] CM HTA/ HRA Screening Call-- third unsuccessful attempt    Unsuccessful third attempt for telephone outreach to patient for HTA- HRA screening.  First attempt 12/01/17: phone rang without physical answer/ voicemail pick up; unable to leave voice message requesting call back Second attempt 12/04/17:  Left patient HIPAA compliant voice message, requesting call back Third attempt today 12/07/17: Left patient HIPAA compliant voice message, requesting call back  Oneta Rack, RN, BSN, [facility]  [phone]

## 2018-03-04 DIAGNOSIS — M9901 Segmental and somatic dysfunction of cervical region: Secondary | ICD-10-CM | POA: Diagnosis not present

## 2018-03-04 DIAGNOSIS — M5386 Other specified dorsopathies, lumbar region: Secondary | ICD-10-CM | POA: Diagnosis not present

## 2018-03-04 DIAGNOSIS — M9903 Segmental and somatic dysfunction of lumbar region: Secondary | ICD-10-CM | POA: Diagnosis not present

## 2018-03-04 DIAGNOSIS — M9902 Segmental and somatic dysfunction of thoracic region: Secondary | ICD-10-CM | POA: Diagnosis not present

## 2018-03-04 DIAGNOSIS — M5432 Sciatica, left side: Secondary | ICD-10-CM | POA: Diagnosis not present

## 2018-03-04 DIAGNOSIS — R51 Headache: Secondary | ICD-10-CM | POA: Diagnosis not present

## 2018-03-08 DIAGNOSIS — M9902 Segmental and somatic dysfunction of thoracic region: Secondary | ICD-10-CM | POA: Diagnosis not present

## 2018-03-08 DIAGNOSIS — M9903 Segmental and somatic dysfunction of lumbar region: Secondary | ICD-10-CM | POA: Diagnosis not present

## 2018-03-08 DIAGNOSIS — M5432 Sciatica, left side: Secondary | ICD-10-CM | POA: Diagnosis not present

## 2018-03-08 DIAGNOSIS — M5386 Other specified dorsopathies, lumbar region: Secondary | ICD-10-CM | POA: Diagnosis not present

## 2018-03-08 DIAGNOSIS — R51 Headache: Secondary | ICD-10-CM | POA: Diagnosis not present

## 2018-03-08 DIAGNOSIS — M9901 Segmental and somatic dysfunction of cervical region: Secondary | ICD-10-CM | POA: Diagnosis not present

## 2018-03-10 DIAGNOSIS — M9903 Segmental and somatic dysfunction of lumbar region: Secondary | ICD-10-CM | POA: Diagnosis not present

## 2018-03-10 DIAGNOSIS — M9901 Segmental and somatic dysfunction of cervical region: Secondary | ICD-10-CM | POA: Diagnosis not present

## 2018-03-10 DIAGNOSIS — M9902 Segmental and somatic dysfunction of thoracic region: Secondary | ICD-10-CM | POA: Diagnosis not present

## 2018-03-10 DIAGNOSIS — R51 Headache: Secondary | ICD-10-CM | POA: Diagnosis not present

## 2018-03-10 DIAGNOSIS — M5432 Sciatica, left side: Secondary | ICD-10-CM | POA: Diagnosis not present

## 2018-03-10 DIAGNOSIS — M5386 Other specified dorsopathies, lumbar region: Secondary | ICD-10-CM | POA: Diagnosis not present

## 2018-03-22 DIAGNOSIS — Z6826 Body mass index (BMI) 26.0-26.9, adult: Secondary | ICD-10-CM | POA: Diagnosis not present

## 2018-03-22 DIAGNOSIS — J3489 Other specified disorders of nose and nasal sinuses: Secondary | ICD-10-CM | POA: Diagnosis not present

## 2018-03-22 DIAGNOSIS — Z23 Encounter for immunization: Secondary | ICD-10-CM | POA: Diagnosis not present

## 2018-03-22 DIAGNOSIS — N390 Urinary tract infection, site not specified: Secondary | ICD-10-CM | POA: Diagnosis not present

## 2018-03-22 DIAGNOSIS — R300 Dysuria: Secondary | ICD-10-CM | POA: Diagnosis not present

## 2018-03-24 DIAGNOSIS — M9901 Segmental and somatic dysfunction of cervical region: Secondary | ICD-10-CM | POA: Diagnosis not present

## 2018-03-24 DIAGNOSIS — M9902 Segmental and somatic dysfunction of thoracic region: Secondary | ICD-10-CM | POA: Diagnosis not present

## 2018-03-24 DIAGNOSIS — M5432 Sciatica, left side: Secondary | ICD-10-CM | POA: Diagnosis not present

## 2018-03-24 DIAGNOSIS — M9903 Segmental and somatic dysfunction of lumbar region: Secondary | ICD-10-CM | POA: Diagnosis not present

## 2018-03-24 DIAGNOSIS — R51 Headache: Secondary | ICD-10-CM | POA: Diagnosis not present

## 2018-03-24 DIAGNOSIS — M5386 Other specified dorsopathies, lumbar region: Secondary | ICD-10-CM | POA: Diagnosis not present

## 2018-03-29 DIAGNOSIS — M9901 Segmental and somatic dysfunction of cervical region: Secondary | ICD-10-CM | POA: Diagnosis not present

## 2018-03-29 DIAGNOSIS — R51 Headache: Secondary | ICD-10-CM | POA: Diagnosis not present

## 2018-03-29 DIAGNOSIS — M5386 Other specified dorsopathies, lumbar region: Secondary | ICD-10-CM | POA: Diagnosis not present

## 2018-03-29 DIAGNOSIS — M5432 Sciatica, left side: Secondary | ICD-10-CM | POA: Diagnosis not present

## 2018-03-29 DIAGNOSIS — M9902 Segmental and somatic dysfunction of thoracic region: Secondary | ICD-10-CM | POA: Diagnosis not present

## 2018-03-29 DIAGNOSIS — M9903 Segmental and somatic dysfunction of lumbar region: Secondary | ICD-10-CM | POA: Diagnosis not present

## 2018-08-12 DIAGNOSIS — N9489 Other specified conditions associated with female genital organs and menstrual cycle: Secondary | ICD-10-CM | POA: Diagnosis not present

## 2018-08-12 DIAGNOSIS — F419 Anxiety disorder, unspecified: Secondary | ICD-10-CM | POA: Diagnosis not present

## 2018-08-12 DIAGNOSIS — G890 Central pain syndrome: Secondary | ICD-10-CM | POA: Diagnosis not present

## 2018-08-12 DIAGNOSIS — G588 Other specified mononeuropathies: Secondary | ICD-10-CM | POA: Diagnosis not present

## 2018-08-12 DIAGNOSIS — R3915 Urgency of urination: Secondary | ICD-10-CM | POA: Diagnosis not present

## 2018-09-21 DIAGNOSIS — Z1331 Encounter for screening for depression: Secondary | ICD-10-CM | POA: Diagnosis not present

## 2018-09-21 DIAGNOSIS — Z6826 Body mass index (BMI) 26.0-26.9, adult: Secondary | ICD-10-CM | POA: Diagnosis not present

## 2018-09-21 DIAGNOSIS — R300 Dysuria: Secondary | ICD-10-CM | POA: Diagnosis not present

## 2018-12-20 DIAGNOSIS — R5381 Other malaise: Secondary | ICD-10-CM | POA: Diagnosis not present

## 2018-12-20 DIAGNOSIS — N941 Unspecified dyspareunia: Secondary | ICD-10-CM | POA: Diagnosis not present

## 2018-12-20 DIAGNOSIS — R7309 Other abnormal glucose: Secondary | ICD-10-CM | POA: Diagnosis not present

## 2018-12-20 DIAGNOSIS — R5383 Other fatigue: Secondary | ICD-10-CM | POA: Diagnosis not present

## 2018-12-20 DIAGNOSIS — J3489 Other specified disorders of nose and nasal sinuses: Secondary | ICD-10-CM | POA: Diagnosis not present

## 2018-12-20 DIAGNOSIS — Z6826 Body mass index (BMI) 26.0-26.9, adult: Secondary | ICD-10-CM | POA: Diagnosis not present

## 2018-12-20 DIAGNOSIS — F329 Major depressive disorder, single episode, unspecified: Secondary | ICD-10-CM | POA: Diagnosis not present

## 2019-01-17 DIAGNOSIS — E785 Hyperlipidemia, unspecified: Secondary | ICD-10-CM | POA: Diagnosis not present

## 2019-01-17 DIAGNOSIS — Z0000 Encounter for general adult medical examination without abnormal findings: Secondary | ICD-10-CM | POA: Diagnosis not present

## 2019-01-17 DIAGNOSIS — Z1231 Encounter for screening mammogram for malignant neoplasm of breast: Secondary | ICD-10-CM | POA: Diagnosis not present

## 2019-01-17 DIAGNOSIS — Z9181 History of falling: Secondary | ICD-10-CM | POA: Diagnosis not present

## 2019-01-17 DIAGNOSIS — Z1331 Encounter for screening for depression: Secondary | ICD-10-CM | POA: Diagnosis not present

## 2019-05-02 DIAGNOSIS — Z20822 Contact with and (suspected) exposure to covid-19: Secondary | ICD-10-CM | POA: Diagnosis not present

## 2019-05-02 DIAGNOSIS — R6889 Other general symptoms and signs: Secondary | ICD-10-CM | POA: Diagnosis not present

## 2019-05-02 DIAGNOSIS — J029 Acute pharyngitis, unspecified: Secondary | ICD-10-CM | POA: Diagnosis not present

## 2019-05-24 DIAGNOSIS — G43009 Migraine without aura, not intractable, without status migrainosus: Secondary | ICD-10-CM | POA: Diagnosis not present

## 2019-05-24 DIAGNOSIS — R42 Dizziness and giddiness: Secondary | ICD-10-CM | POA: Diagnosis not present

## 2019-06-15 DIAGNOSIS — R3915 Urgency of urination: Secondary | ICD-10-CM | POA: Diagnosis not present

## 2019-06-15 DIAGNOSIS — G588 Other specified mononeuropathies: Secondary | ICD-10-CM | POA: Diagnosis not present

## 2019-06-15 DIAGNOSIS — G890 Central pain syndrome: Secondary | ICD-10-CM | POA: Diagnosis not present

## 2019-06-15 DIAGNOSIS — N9489 Other specified conditions associated with female genital organs and menstrual cycle: Secondary | ICD-10-CM | POA: Diagnosis not present

## 2019-06-17 DIAGNOSIS — Z6827 Body mass index (BMI) 27.0-27.9, adult: Secondary | ICD-10-CM | POA: Diagnosis not present

## 2019-06-17 DIAGNOSIS — D489 Neoplasm of uncertain behavior, unspecified: Secondary | ICD-10-CM | POA: Diagnosis not present

## 2019-06-17 DIAGNOSIS — M79642 Pain in left hand: Secondary | ICD-10-CM | POA: Diagnosis not present

## 2019-06-17 DIAGNOSIS — G43009 Migraine without aura, not intractable, without status migrainosus: Secondary | ICD-10-CM | POA: Diagnosis not present

## 2019-06-17 DIAGNOSIS — M79641 Pain in right hand: Secondary | ICD-10-CM | POA: Diagnosis not present

## 2019-10-21 DIAGNOSIS — N9489 Other specified conditions associated with female genital organs and menstrual cycle: Secondary | ICD-10-CM | POA: Diagnosis not present

## 2019-10-21 DIAGNOSIS — G588 Other specified mononeuropathies: Secondary | ICD-10-CM | POA: Diagnosis not present

## 2019-10-21 DIAGNOSIS — G890 Central pain syndrome: Secondary | ICD-10-CM | POA: Diagnosis not present

## 2019-10-21 DIAGNOSIS — R3915 Urgency of urination: Secondary | ICD-10-CM | POA: Diagnosis not present

## 2019-10-21 DIAGNOSIS — R6882 Decreased libido: Secondary | ICD-10-CM | POA: Diagnosis not present

## 2020-01-25 DIAGNOSIS — Z1331 Encounter for screening for depression: Secondary | ICD-10-CM | POA: Diagnosis not present

## 2020-01-25 DIAGNOSIS — Z9181 History of falling: Secondary | ICD-10-CM | POA: Diagnosis not present

## 2020-01-25 DIAGNOSIS — Z0000 Encounter for general adult medical examination without abnormal findings: Secondary | ICD-10-CM | POA: Diagnosis not present

## 2020-02-27 DIAGNOSIS — Z23 Encounter for immunization: Secondary | ICD-10-CM | POA: Diagnosis not present

## 2020-02-27 DIAGNOSIS — R5383 Other fatigue: Secondary | ICD-10-CM | POA: Diagnosis not present

## 2020-02-27 DIAGNOSIS — F330 Major depressive disorder, recurrent, mild: Secondary | ICD-10-CM | POA: Diagnosis not present

## 2020-02-27 DIAGNOSIS — Z6828 Body mass index (BMI) 28.0-28.9, adult: Secondary | ICD-10-CM | POA: Diagnosis not present

## 2020-02-27 DIAGNOSIS — R5381 Other malaise: Secondary | ICD-10-CM | POA: Diagnosis not present

## 2020-02-27 DIAGNOSIS — Z1231 Encounter for screening mammogram for malignant neoplasm of breast: Secondary | ICD-10-CM | POA: Diagnosis not present

## 2020-04-04 DIAGNOSIS — R3989 Other symptoms and signs involving the genitourinary system: Secondary | ICD-10-CM | POA: Diagnosis not present

## 2020-04-04 DIAGNOSIS — G4700 Insomnia, unspecified: Secondary | ICD-10-CM | POA: Diagnosis not present

## 2020-04-04 DIAGNOSIS — K589 Irritable bowel syndrome without diarrhea: Secondary | ICD-10-CM | POA: Diagnosis not present

## 2020-04-04 DIAGNOSIS — R102 Pelvic and perineal pain: Secondary | ICD-10-CM | POA: Diagnosis not present

## 2020-04-04 DIAGNOSIS — G588 Other specified mononeuropathies: Secondary | ICD-10-CM | POA: Diagnosis not present

## 2020-04-04 DIAGNOSIS — N941 Unspecified dyspareunia: Secondary | ICD-10-CM | POA: Diagnosis not present

## 2020-04-04 DIAGNOSIS — G43909 Migraine, unspecified, not intractable, without status migrainosus: Secondary | ICD-10-CM | POA: Diagnosis not present

## 2020-04-04 DIAGNOSIS — R6882 Decreased libido: Secondary | ICD-10-CM | POA: Diagnosis not present

## 2020-04-04 DIAGNOSIS — G8929 Other chronic pain: Secondary | ICD-10-CM | POA: Diagnosis not present

## 2020-04-30 DIAGNOSIS — R6889 Other general symptoms and signs: Secondary | ICD-10-CM | POA: Diagnosis not present

## 2020-08-26 DIAGNOSIS — J209 Acute bronchitis, unspecified: Secondary | ICD-10-CM | POA: Diagnosis not present

## 2020-12-18 DIAGNOSIS — K582 Mixed irritable bowel syndrome: Secondary | ICD-10-CM | POA: Diagnosis not present

## 2020-12-18 DIAGNOSIS — F330 Major depressive disorder, recurrent, mild: Secondary | ICD-10-CM | POA: Diagnosis not present

## 2020-12-18 DIAGNOSIS — G43009 Migraine without aura, not intractable, without status migrainosus: Secondary | ICD-10-CM | POA: Diagnosis not present

## 2020-12-18 DIAGNOSIS — Z6824 Body mass index (BMI) 24.0-24.9, adult: Secondary | ICD-10-CM | POA: Diagnosis not present

## 2020-12-18 DIAGNOSIS — R634 Abnormal weight loss: Secondary | ICD-10-CM | POA: Diagnosis not present

## 2020-12-18 DIAGNOSIS — R5381 Other malaise: Secondary | ICD-10-CM | POA: Diagnosis not present

## 2020-12-18 DIAGNOSIS — R1084 Generalized abdominal pain: Secondary | ICD-10-CM | POA: Diagnosis not present

## 2020-12-27 DIAGNOSIS — R159 Full incontinence of feces: Secondary | ICD-10-CM | POA: Diagnosis not present

## 2020-12-27 DIAGNOSIS — R194 Change in bowel habit: Secondary | ICD-10-CM | POA: Diagnosis not present

## 2020-12-27 DIAGNOSIS — K589 Irritable bowel syndrome without diarrhea: Secondary | ICD-10-CM | POA: Diagnosis not present

## 2020-12-27 DIAGNOSIS — R109 Unspecified abdominal pain: Secondary | ICD-10-CM | POA: Diagnosis not present

## 2020-12-27 DIAGNOSIS — Z8719 Personal history of other diseases of the digestive system: Secondary | ICD-10-CM | POA: Diagnosis not present

## 2020-12-27 DIAGNOSIS — R10817 Generalized abdominal tenderness: Secondary | ICD-10-CM | POA: Diagnosis not present

## 2021-01-11 DIAGNOSIS — Z23 Encounter for immunization: Secondary | ICD-10-CM | POA: Diagnosis not present

## 2021-01-28 DIAGNOSIS — Z0000 Encounter for general adult medical examination without abnormal findings: Secondary | ICD-10-CM | POA: Diagnosis not present

## 2021-01-28 DIAGNOSIS — Z1331 Encounter for screening for depression: Secondary | ICD-10-CM | POA: Diagnosis not present

## 2021-01-28 DIAGNOSIS — E785 Hyperlipidemia, unspecified: Secondary | ICD-10-CM | POA: Diagnosis not present

## 2021-01-28 DIAGNOSIS — Z9181 History of falling: Secondary | ICD-10-CM | POA: Diagnosis not present

## 2021-02-15 DIAGNOSIS — R194 Change in bowel habit: Secondary | ICD-10-CM | POA: Diagnosis not present

## 2021-02-15 DIAGNOSIS — K640 First degree hemorrhoids: Secondary | ICD-10-CM | POA: Diagnosis not present

## 2021-02-15 DIAGNOSIS — R159 Full incontinence of feces: Secondary | ICD-10-CM | POA: Diagnosis not present

## 2021-02-15 DIAGNOSIS — K648 Other hemorrhoids: Secondary | ICD-10-CM | POA: Diagnosis not present

## 2021-02-15 DIAGNOSIS — R195 Other fecal abnormalities: Secondary | ICD-10-CM | POA: Diagnosis not present

## 2021-02-15 DIAGNOSIS — Z980 Intestinal bypass and anastomosis status: Secondary | ICD-10-CM | POA: Diagnosis not present

## 2021-02-15 DIAGNOSIS — Z9889 Other specified postprocedural states: Secondary | ICD-10-CM | POA: Diagnosis not present

## 2021-02-15 DIAGNOSIS — K5730 Diverticulosis of large intestine without perforation or abscess without bleeding: Secondary | ICD-10-CM | POA: Diagnosis not present

## 2021-02-27 DIAGNOSIS — M5126 Other intervertebral disc displacement, lumbar region: Secondary | ICD-10-CM | POA: Diagnosis not present

## 2021-02-27 DIAGNOSIS — M9902 Segmental and somatic dysfunction of thoracic region: Secondary | ICD-10-CM | POA: Diagnosis not present

## 2021-02-27 DIAGNOSIS — M9903 Segmental and somatic dysfunction of lumbar region: Secondary | ICD-10-CM | POA: Diagnosis not present

## 2021-02-27 DIAGNOSIS — M5124 Other intervertebral disc displacement, thoracic region: Secondary | ICD-10-CM | POA: Diagnosis not present

## 2021-02-27 DIAGNOSIS — M9901 Segmental and somatic dysfunction of cervical region: Secondary | ICD-10-CM | POA: Diagnosis not present

## 2021-02-27 DIAGNOSIS — M5022 Other cervical disc displacement, mid-cervical region, unspecified level: Secondary | ICD-10-CM | POA: Diagnosis not present

## 2021-03-04 DIAGNOSIS — M5022 Other cervical disc displacement, mid-cervical region, unspecified level: Secondary | ICD-10-CM | POA: Diagnosis not present

## 2021-03-04 DIAGNOSIS — M5126 Other intervertebral disc displacement, lumbar region: Secondary | ICD-10-CM | POA: Diagnosis not present

## 2021-03-04 DIAGNOSIS — M9902 Segmental and somatic dysfunction of thoracic region: Secondary | ICD-10-CM | POA: Diagnosis not present

## 2021-03-04 DIAGNOSIS — M9901 Segmental and somatic dysfunction of cervical region: Secondary | ICD-10-CM | POA: Diagnosis not present

## 2021-03-04 DIAGNOSIS — M9903 Segmental and somatic dysfunction of lumbar region: Secondary | ICD-10-CM | POA: Diagnosis not present

## 2021-03-04 DIAGNOSIS — M5124 Other intervertebral disc displacement, thoracic region: Secondary | ICD-10-CM | POA: Diagnosis not present

## 2021-03-07 DIAGNOSIS — H9201 Otalgia, right ear: Secondary | ICD-10-CM | POA: Diagnosis not present

## 2021-03-07 DIAGNOSIS — S022XXD Fracture of nasal bones, subsequent encounter for fracture with routine healing: Secondary | ICD-10-CM | POA: Diagnosis not present

## 2021-03-07 DIAGNOSIS — J342 Deviated nasal septum: Secondary | ICD-10-CM | POA: Diagnosis not present

## 2021-04-12 DIAGNOSIS — H903 Sensorineural hearing loss, bilateral: Secondary | ICD-10-CM | POA: Diagnosis not present

## 2021-05-24 DIAGNOSIS — G890 Central pain syndrome: Secondary | ICD-10-CM | POA: Diagnosis not present

## 2021-05-24 DIAGNOSIS — R102 Pelvic and perineal pain: Secondary | ICD-10-CM | POA: Diagnosis not present

## 2021-05-24 DIAGNOSIS — G588 Other specified mononeuropathies: Secondary | ICD-10-CM | POA: Diagnosis not present

## 2021-05-24 DIAGNOSIS — N9489 Other specified conditions associated with female genital organs and menstrual cycle: Secondary | ICD-10-CM | POA: Diagnosis not present

## 2021-06-14 DIAGNOSIS — R1084 Generalized abdominal pain: Secondary | ICD-10-CM | POA: Diagnosis not present

## 2021-06-14 DIAGNOSIS — G43009 Migraine without aura, not intractable, without status migrainosus: Secondary | ICD-10-CM | POA: Diagnosis not present

## 2021-06-14 DIAGNOSIS — Z6824 Body mass index (BMI) 24.0-24.9, adult: Secondary | ICD-10-CM | POA: Diagnosis not present

## 2021-06-14 DIAGNOSIS — R079 Chest pain, unspecified: Secondary | ICD-10-CM | POA: Diagnosis not present

## 2021-06-14 DIAGNOSIS — R0789 Other chest pain: Secondary | ICD-10-CM | POA: Diagnosis not present

## 2021-06-14 DIAGNOSIS — R5381 Other malaise: Secondary | ICD-10-CM | POA: Diagnosis not present

## 2021-06-14 DIAGNOSIS — R634 Abnormal weight loss: Secondary | ICD-10-CM | POA: Diagnosis not present

## 2021-06-14 DIAGNOSIS — Z13220 Encounter for screening for lipoid disorders: Secondary | ICD-10-CM | POA: Diagnosis not present

## 2021-06-14 DIAGNOSIS — F330 Major depressive disorder, recurrent, mild: Secondary | ICD-10-CM | POA: Diagnosis not present

## 2021-06-14 DIAGNOSIS — R5383 Other fatigue: Secondary | ICD-10-CM | POA: Diagnosis not present

## 2021-06-14 DIAGNOSIS — N644 Mastodynia: Secondary | ICD-10-CM | POA: Diagnosis not present

## 2021-07-05 ENCOUNTER — Telehealth: Payer: Self-pay | Admitting: Gastroenterology

## 2021-07-05 NOTE — Telephone Encounter (Signed)
Hey Dr. Silverio Decamp,  ? ?Inbound call from patient wanting to transfer care back you for abd pain. Patient recently was seen at [facility] 2022 and did not like their care. Records are in Epic. Could you please review and advise on if I can schedule patient with you? ? ?Thank you ?

## 2021-07-05 NOTE — Telephone Encounter (Signed)
Request received to transfer GI care from outside practice to [facility].  We appreciate the interest in our practice, however at this time due to high demand from patients without established GI providers we cannot accommodate this transfer.  Ability to accommodate future transfer requests may change over time and the patient can contact us again in 6-12 months if still interested in being seen at [facility].      °

## 2021-07-08 DIAGNOSIS — N644 Mastodynia: Secondary | ICD-10-CM | POA: Diagnosis not present

## 2021-07-08 DIAGNOSIS — R928 Other abnormal and inconclusive findings on diagnostic imaging of breast: Secondary | ICD-10-CM | POA: Diagnosis not present

## 2021-07-10 NOTE — Telephone Encounter (Signed)
Lft vm advise unable to accept transfer of care at this time ?

## 2021-07-19 ENCOUNTER — Telehealth: Payer: Self-pay | Admitting: Gastroenterology

## 2021-07-19 NOTE — Telephone Encounter (Signed)
error 

## 2021-07-22 NOTE — Telephone Encounter (Signed)
LM on vmail to advise patient that we are unable to accept transfer of care at this time  ?

## 2021-09-13 DIAGNOSIS — G8929 Other chronic pain: Secondary | ICD-10-CM | POA: Diagnosis not present

## 2021-09-13 DIAGNOSIS — R102 Pelvic and perineal pain: Secondary | ICD-10-CM | POA: Diagnosis not present

## 2021-09-13 DIAGNOSIS — K582 Mixed irritable bowel syndrome: Secondary | ICD-10-CM | POA: Diagnosis not present

## 2021-09-13 DIAGNOSIS — M65331 Trigger finger, right middle finger: Secondary | ICD-10-CM | POA: Diagnosis not present

## 2021-09-13 DIAGNOSIS — F330 Major depressive disorder, recurrent, mild: Secondary | ICD-10-CM | POA: Diagnosis not present

## 2021-09-13 DIAGNOSIS — F419 Anxiety disorder, unspecified: Secondary | ICD-10-CM | POA: Diagnosis not present

## 2021-10-01 DIAGNOSIS — H2513 Age-related nuclear cataract, bilateral: Secondary | ICD-10-CM | POA: Diagnosis not present

## 2021-10-01 DIAGNOSIS — H18413 Arcus senilis, bilateral: Secondary | ICD-10-CM | POA: Diagnosis not present

## 2021-10-01 DIAGNOSIS — H25013 Cortical age-related cataract, bilateral: Secondary | ICD-10-CM | POA: Diagnosis not present

## 2021-10-01 DIAGNOSIS — H2511 Age-related nuclear cataract, right eye: Secondary | ICD-10-CM | POA: Diagnosis not present

## 2021-10-01 DIAGNOSIS — H25043 Posterior subcapsular polar age-related cataract, bilateral: Secondary | ICD-10-CM | POA: Diagnosis not present

## 2021-11-27 DIAGNOSIS — M79641 Pain in right hand: Secondary | ICD-10-CM | POA: Diagnosis not present

## 2021-11-27 DIAGNOSIS — M65331 Trigger finger, right middle finger: Secondary | ICD-10-CM | POA: Diagnosis not present

## 2021-12-30 DIAGNOSIS — M65331 Trigger finger, right middle finger: Secondary | ICD-10-CM | POA: Diagnosis not present

## 2021-12-30 DIAGNOSIS — M79641 Pain in right hand: Secondary | ICD-10-CM | POA: Diagnosis not present

## 2022-01-06 DIAGNOSIS — H2511 Age-related nuclear cataract, right eye: Secondary | ICD-10-CM | POA: Diagnosis not present

## 2022-01-07 DIAGNOSIS — H25012 Cortical age-related cataract, left eye: Secondary | ICD-10-CM | POA: Diagnosis not present

## 2022-01-07 DIAGNOSIS — H2512 Age-related nuclear cataract, left eye: Secondary | ICD-10-CM | POA: Diagnosis not present

## 2022-01-07 DIAGNOSIS — H25042 Posterior subcapsular polar age-related cataract, left eye: Secondary | ICD-10-CM | POA: Diagnosis not present

## 2022-01-20 DIAGNOSIS — H2512 Age-related nuclear cataract, left eye: Secondary | ICD-10-CM | POA: Diagnosis not present

## 2022-01-27 DIAGNOSIS — T8544XA Capsular contracture of breast implant, initial encounter: Secondary | ICD-10-CM | POA: Diagnosis not present

## 2022-01-27 DIAGNOSIS — Z9882 Breast implant status: Secondary | ICD-10-CM | POA: Diagnosis not present

## 2022-02-03 DIAGNOSIS — R531 Weakness: Secondary | ICD-10-CM | POA: Diagnosis not present

## 2022-02-03 DIAGNOSIS — R4182 Altered mental status, unspecified: Secondary | ICD-10-CM | POA: Diagnosis not present

## 2022-02-03 DIAGNOSIS — R112 Nausea with vomiting, unspecified: Secondary | ICD-10-CM | POA: Diagnosis not present

## 2022-02-04 DIAGNOSIS — Z79899 Other long term (current) drug therapy: Secondary | ICD-10-CM | POA: Diagnosis not present

## 2022-02-04 DIAGNOSIS — I10 Essential (primary) hypertension: Secondary | ICD-10-CM | POA: Diagnosis not present

## 2022-02-04 DIAGNOSIS — R7989 Other specified abnormal findings of blood chemistry: Secondary | ICD-10-CM | POA: Diagnosis not present

## 2022-02-04 DIAGNOSIS — R531 Weakness: Secondary | ICD-10-CM | POA: Diagnosis not present

## 2022-02-04 DIAGNOSIS — N390 Urinary tract infection, site not specified: Secondary | ICD-10-CM | POA: Diagnosis not present

## 2022-02-04 DIAGNOSIS — Z23 Encounter for immunization: Secondary | ICD-10-CM | POA: Diagnosis not present

## 2022-02-04 DIAGNOSIS — R739 Hyperglycemia, unspecified: Secondary | ICD-10-CM | POA: Diagnosis not present

## 2022-02-28 DIAGNOSIS — Z1211 Encounter for screening for malignant neoplasm of colon: Secondary | ICD-10-CM | POA: Diagnosis not present

## 2022-02-28 DIAGNOSIS — K589 Irritable bowel syndrome without diarrhea: Secondary | ICD-10-CM | POA: Diagnosis not present

## 2022-03-31 DIAGNOSIS — G5793 Unspecified mononeuropathy of bilateral lower limbs: Secondary | ICD-10-CM | POA: Diagnosis not present

## 2022-03-31 DIAGNOSIS — R739 Hyperglycemia, unspecified: Secondary | ICD-10-CM | POA: Diagnosis not present

## 2022-04-11 DIAGNOSIS — D485 Neoplasm of uncertain behavior of skin: Secondary | ICD-10-CM | POA: Diagnosis not present

## 2022-04-11 DIAGNOSIS — D2239 Melanocytic nevi of other parts of face: Secondary | ICD-10-CM | POA: Diagnosis not present

## 2022-04-11 DIAGNOSIS — L570 Actinic keratosis: Secondary | ICD-10-CM | POA: Diagnosis not present

## 2022-04-11 DIAGNOSIS — L814 Other melanin hyperpigmentation: Secondary | ICD-10-CM | POA: Diagnosis not present

## 2022-04-11 DIAGNOSIS — D225 Melanocytic nevi of trunk: Secondary | ICD-10-CM | POA: Diagnosis not present

## 2022-04-11 DIAGNOSIS — L820 Inflamed seborrheic keratosis: Secondary | ICD-10-CM | POA: Diagnosis not present

## 2022-05-08 DIAGNOSIS — Z9181 History of falling: Secondary | ICD-10-CM | POA: Diagnosis not present

## 2022-05-08 DIAGNOSIS — R739 Hyperglycemia, unspecified: Secondary | ICD-10-CM | POA: Diagnosis not present

## 2022-05-08 DIAGNOSIS — Z139 Encounter for screening, unspecified: Secondary | ICD-10-CM | POA: Diagnosis not present

## 2022-05-08 DIAGNOSIS — K582 Mixed irritable bowel syndrome: Secondary | ICD-10-CM | POA: Diagnosis not present

## 2022-05-08 DIAGNOSIS — R102 Pelvic and perineal pain: Secondary | ICD-10-CM | POA: Diagnosis not present

## 2022-05-08 DIAGNOSIS — F330 Major depressive disorder, recurrent, mild: Secondary | ICD-10-CM | POA: Diagnosis not present

## 2022-05-08 DIAGNOSIS — Z79899 Other long term (current) drug therapy: Secondary | ICD-10-CM | POA: Diagnosis not present

## 2022-05-08 DIAGNOSIS — G8929 Other chronic pain: Secondary | ICD-10-CM | POA: Diagnosis not present

## 2022-05-08 DIAGNOSIS — Z13220 Encounter for screening for lipoid disorders: Secondary | ICD-10-CM | POA: Diagnosis not present

## 2022-05-08 DIAGNOSIS — F419 Anxiety disorder, unspecified: Secondary | ICD-10-CM | POA: Diagnosis not present

## 2022-08-08 DIAGNOSIS — R739 Hyperglycemia, unspecified: Secondary | ICD-10-CM | POA: Diagnosis not present

## 2022-08-08 DIAGNOSIS — G8929 Other chronic pain: Secondary | ICD-10-CM | POA: Diagnosis not present

## 2022-08-08 DIAGNOSIS — F330 Major depressive disorder, recurrent, mild: Secondary | ICD-10-CM | POA: Diagnosis not present

## 2022-08-08 DIAGNOSIS — K582 Mixed irritable bowel syndrome: Secondary | ICD-10-CM | POA: Diagnosis not present

## 2022-08-08 DIAGNOSIS — R102 Pelvic and perineal pain: Secondary | ICD-10-CM | POA: Diagnosis not present

## 2022-08-08 DIAGNOSIS — Z13220 Encounter for screening for lipoid disorders: Secondary | ICD-10-CM | POA: Diagnosis not present

## 2022-08-08 DIAGNOSIS — R7303 Prediabetes: Secondary | ICD-10-CM | POA: Diagnosis not present

## 2022-08-08 DIAGNOSIS — Z79899 Other long term (current) drug therapy: Secondary | ICD-10-CM | POA: Diagnosis not present

## 2022-11-07 DIAGNOSIS — H43811 Vitreous degeneration, right eye: Secondary | ICD-10-CM | POA: Diagnosis not present

## 2022-11-25 DIAGNOSIS — R102 Pelvic and perineal pain: Secondary | ICD-10-CM | POA: Diagnosis not present

## 2022-11-25 DIAGNOSIS — F419 Anxiety disorder, unspecified: Secondary | ICD-10-CM | POA: Diagnosis not present

## 2022-11-25 DIAGNOSIS — F330 Major depressive disorder, recurrent, mild: Secondary | ICD-10-CM | POA: Diagnosis not present

## 2022-11-25 DIAGNOSIS — G43009 Migraine without aura, not intractable, without status migrainosus: Secondary | ICD-10-CM | POA: Diagnosis not present

## 2022-11-25 DIAGNOSIS — R7303 Prediabetes: Secondary | ICD-10-CM | POA: Diagnosis not present

## 2022-11-25 DIAGNOSIS — Z79899 Other long term (current) drug therapy: Secondary | ICD-10-CM | POA: Diagnosis not present

## 2022-11-25 DIAGNOSIS — K582 Mixed irritable bowel syndrome: Secondary | ICD-10-CM | POA: Diagnosis not present

## 2022-11-25 DIAGNOSIS — G8929 Other chronic pain: Secondary | ICD-10-CM | POA: Diagnosis not present

## 2023-02-26 DIAGNOSIS — K5900 Constipation, unspecified: Secondary | ICD-10-CM | POA: Diagnosis not present

## 2023-02-26 DIAGNOSIS — K649 Unspecified hemorrhoids: Secondary | ICD-10-CM | POA: Diagnosis not present

## 2023-02-26 DIAGNOSIS — Z01818 Encounter for other preprocedural examination: Secondary | ICD-10-CM | POA: Diagnosis not present

## 2023-02-26 DIAGNOSIS — K5790 Diverticulosis of intestine, part unspecified, without perforation or abscess without bleeding: Secondary | ICD-10-CM | POA: Diagnosis not present

## 2023-02-27 DIAGNOSIS — G8929 Other chronic pain: Secondary | ICD-10-CM | POA: Diagnosis not present

## 2023-02-27 DIAGNOSIS — R7303 Prediabetes: Secondary | ICD-10-CM | POA: Diagnosis not present

## 2023-02-27 DIAGNOSIS — K582 Mixed irritable bowel syndrome: Secondary | ICD-10-CM | POA: Diagnosis not present

## 2023-02-27 DIAGNOSIS — R21 Rash and other nonspecific skin eruption: Secondary | ICD-10-CM | POA: Diagnosis not present

## 2023-02-27 DIAGNOSIS — Z79899 Other long term (current) drug therapy: Secondary | ICD-10-CM | POA: Diagnosis not present

## 2023-02-27 DIAGNOSIS — G43009 Migraine without aura, not intractable, without status migrainosus: Secondary | ICD-10-CM | POA: Diagnosis not present

## 2023-02-27 DIAGNOSIS — F330 Major depressive disorder, recurrent, mild: Secondary | ICD-10-CM | POA: Diagnosis not present

## 2023-02-27 DIAGNOSIS — R102 Pelvic and perineal pain: Secondary | ICD-10-CM | POA: Diagnosis not present

## 2023-02-27 DIAGNOSIS — R61 Generalized hyperhidrosis: Secondary | ICD-10-CM | POA: Diagnosis not present

## 2023-02-27 DIAGNOSIS — Z23 Encounter for immunization: Secondary | ICD-10-CM | POA: Diagnosis not present

## 2023-03-31 DIAGNOSIS — Z1211 Encounter for screening for malignant neoplasm of colon: Secondary | ICD-10-CM | POA: Diagnosis not present

## 2023-03-31 DIAGNOSIS — K5730 Diverticulosis of large intestine without perforation or abscess without bleeding: Secondary | ICD-10-CM | POA: Diagnosis not present

## 2023-03-31 DIAGNOSIS — Z860100 Personal history of colon polyps, unspecified: Secondary | ICD-10-CM | POA: Diagnosis not present

## 2023-04-17 DIAGNOSIS — L309 Dermatitis, unspecified: Secondary | ICD-10-CM | POA: Diagnosis not present

## 2023-04-17 DIAGNOSIS — J069 Acute upper respiratory infection, unspecified: Secondary | ICD-10-CM | POA: Diagnosis not present

## 2023-04-29 DIAGNOSIS — K589 Irritable bowel syndrome without diarrhea: Secondary | ICD-10-CM | POA: Diagnosis not present

## 2023-04-29 DIAGNOSIS — R102 Pelvic and perineal pain: Secondary | ICD-10-CM | POA: Diagnosis not present

## 2023-04-29 DIAGNOSIS — G8929 Other chronic pain: Secondary | ICD-10-CM | POA: Diagnosis not present

## 2023-04-29 DIAGNOSIS — M6289 Other specified disorders of muscle: Secondary | ICD-10-CM | POA: Diagnosis not present

## 2023-04-29 DIAGNOSIS — G588 Other specified mononeuropathies: Secondary | ICD-10-CM | POA: Diagnosis not present

## 2023-05-28 DIAGNOSIS — F419 Anxiety disorder, unspecified: Secondary | ICD-10-CM | POA: Diagnosis not present

## 2023-05-28 DIAGNOSIS — K582 Mixed irritable bowel syndrome: Secondary | ICD-10-CM | POA: Diagnosis not present

## 2023-05-28 DIAGNOSIS — F330 Major depressive disorder, recurrent, mild: Secondary | ICD-10-CM | POA: Diagnosis not present

## 2023-05-28 DIAGNOSIS — Z6828 Body mass index (BMI) 28.0-28.9, adult: Secondary | ICD-10-CM | POA: Diagnosis not present

## 2023-05-28 DIAGNOSIS — Z79899 Other long term (current) drug therapy: Secondary | ICD-10-CM | POA: Diagnosis not present

## 2023-05-28 DIAGNOSIS — G43009 Migraine without aura, not intractable, without status migrainosus: Secondary | ICD-10-CM | POA: Diagnosis not present

## 2023-05-28 DIAGNOSIS — G8929 Other chronic pain: Secondary | ICD-10-CM | POA: Diagnosis not present

## 2023-05-28 DIAGNOSIS — R102 Pelvic and perineal pain: Secondary | ICD-10-CM | POA: Diagnosis not present

## 2023-05-28 DIAGNOSIS — Z139 Encounter for screening, unspecified: Secondary | ICD-10-CM | POA: Diagnosis not present

## 2023-05-28 DIAGNOSIS — R7303 Prediabetes: Secondary | ICD-10-CM | POA: Diagnosis not present

## 2023-06-11 DIAGNOSIS — M6289 Other specified disorders of muscle: Secondary | ICD-10-CM | POA: Diagnosis not present

## 2023-06-11 DIAGNOSIS — N9419 Other specified dyspareunia: Secondary | ICD-10-CM | POA: Diagnosis not present

## 2023-06-11 DIAGNOSIS — N993 Prolapse of vaginal vault after hysterectomy: Secondary | ICD-10-CM | POA: Diagnosis not present

## 2023-06-11 DIAGNOSIS — R32 Unspecified urinary incontinence: Secondary | ICD-10-CM | POA: Diagnosis not present

## 2023-06-11 DIAGNOSIS — N952 Postmenopausal atrophic vaginitis: Secondary | ICD-10-CM | POA: Diagnosis not present

## 2023-08-25 DIAGNOSIS — R7303 Prediabetes: Secondary | ICD-10-CM | POA: Diagnosis not present

## 2023-08-25 DIAGNOSIS — Z79899 Other long term (current) drug therapy: Secondary | ICD-10-CM | POA: Diagnosis not present

## 2023-08-25 DIAGNOSIS — F419 Anxiety disorder, unspecified: Secondary | ICD-10-CM | POA: Diagnosis not present

## 2023-08-25 DIAGNOSIS — G8929 Other chronic pain: Secondary | ICD-10-CM | POA: Diagnosis not present

## 2023-08-25 DIAGNOSIS — R102 Pelvic and perineal pain: Secondary | ICD-10-CM | POA: Diagnosis not present

## 2023-08-25 DIAGNOSIS — Z6827 Body mass index (BMI) 27.0-27.9, adult: Secondary | ICD-10-CM | POA: Diagnosis not present

## 2023-08-25 DIAGNOSIS — K582 Mixed irritable bowel syndrome: Secondary | ICD-10-CM | POA: Diagnosis not present

## 2023-08-25 DIAGNOSIS — G43009 Migraine without aura, not intractable, without status migrainosus: Secondary | ICD-10-CM | POA: Diagnosis not present

## 2023-08-25 DIAGNOSIS — Z9181 History of falling: Secondary | ICD-10-CM | POA: Diagnosis not present

## 2023-08-25 DIAGNOSIS — F330 Major depressive disorder, recurrent, mild: Secondary | ICD-10-CM | POA: Diagnosis not present

## 2023-09-24 DIAGNOSIS — L719 Rosacea, unspecified: Secondary | ICD-10-CM | POA: Diagnosis not present

## 2023-10-21 DIAGNOSIS — N951 Menopausal and female climacteric states: Secondary | ICD-10-CM | POA: Diagnosis not present

## 2023-10-21 DIAGNOSIS — E559 Vitamin D deficiency, unspecified: Secondary | ICD-10-CM | POA: Diagnosis not present

## 2023-10-21 DIAGNOSIS — E039 Hypothyroidism, unspecified: Secondary | ICD-10-CM | POA: Diagnosis not present

## 2023-10-21 DIAGNOSIS — Z77120 Contact with and (suspected) exposure to mold (toxic): Secondary | ICD-10-CM | POA: Diagnosis not present

## 2023-10-21 DIAGNOSIS — E639 Nutritional deficiency, unspecified: Secondary | ICD-10-CM | POA: Diagnosis not present

## 2023-10-21 DIAGNOSIS — R5383 Other fatigue: Secondary | ICD-10-CM | POA: Diagnosis not present

## 2023-11-13 DIAGNOSIS — J340 Abscess, furuncle and carbuncle of nose: Secondary | ICD-10-CM | POA: Diagnosis not present

## 2023-11-13 DIAGNOSIS — L719 Rosacea, unspecified: Secondary | ICD-10-CM | POA: Diagnosis not present

## 2023-11-13 DIAGNOSIS — L820 Inflamed seborrheic keratosis: Secondary | ICD-10-CM | POA: Diagnosis not present

## 2023-11-13 DIAGNOSIS — L0100 Impetigo, unspecified: Secondary | ICD-10-CM | POA: Diagnosis not present

## 2023-11-16 DIAGNOSIS — F419 Anxiety disorder, unspecified: Secondary | ICD-10-CM | POA: Diagnosis not present

## 2023-11-16 DIAGNOSIS — K582 Mixed irritable bowel syndrome: Secondary | ICD-10-CM | POA: Diagnosis not present

## 2023-11-16 DIAGNOSIS — Z6827 Body mass index (BMI) 27.0-27.9, adult: Secondary | ICD-10-CM | POA: Diagnosis not present

## 2023-11-16 DIAGNOSIS — F330 Major depressive disorder, recurrent, mild: Secondary | ICD-10-CM | POA: Diagnosis not present

## 2023-11-16 DIAGNOSIS — G8929 Other chronic pain: Secondary | ICD-10-CM | POA: Diagnosis not present

## 2023-11-16 DIAGNOSIS — R102 Pelvic and perineal pain: Secondary | ICD-10-CM | POA: Diagnosis not present

## 2023-11-16 DIAGNOSIS — R7303 Prediabetes: Secondary | ICD-10-CM | POA: Diagnosis not present

## 2023-11-16 DIAGNOSIS — G43009 Migraine without aura, not intractable, without status migrainosus: Secondary | ICD-10-CM | POA: Diagnosis not present

## 2023-12-16 DIAGNOSIS — T8579XA Infection and inflammatory reaction due to other internal prosthetic devices, implants and grafts, initial encounter: Secondary | ICD-10-CM | POA: Diagnosis not present

## 2023-12-16 DIAGNOSIS — Z9882 Breast implant status: Secondary | ICD-10-CM | POA: Diagnosis not present

## 2023-12-16 DIAGNOSIS — N6489 Other specified disorders of breast: Secondary | ICD-10-CM | POA: Diagnosis not present

## 2023-12-16 DIAGNOSIS — T85848A Pain due to other internal prosthetic devices, implants and grafts, initial encounter: Secondary | ICD-10-CM | POA: Diagnosis not present

## 2023-12-16 DIAGNOSIS — R0789 Other chest pain: Secondary | ICD-10-CM | POA: Diagnosis not present

## 2023-12-16 DIAGNOSIS — T8549XA Other mechanical complication of breast prosthesis and implant, initial encounter: Secondary | ICD-10-CM | POA: Diagnosis not present

## 2023-12-22 DIAGNOSIS — M79605 Pain in left leg: Secondary | ICD-10-CM | POA: Diagnosis not present

## 2023-12-22 DIAGNOSIS — M79662 Pain in left lower leg: Secondary | ICD-10-CM | POA: Diagnosis not present

## 2023-12-28 DIAGNOSIS — N390 Urinary tract infection, site not specified: Secondary | ICD-10-CM | POA: Diagnosis not present

## 2024-01-08 DIAGNOSIS — N368 Other specified disorders of urethra: Secondary | ICD-10-CM | POA: Diagnosis not present

## 2024-01-08 DIAGNOSIS — N952 Postmenopausal atrophic vaginitis: Secondary | ICD-10-CM | POA: Diagnosis not present

## 2024-01-25 DIAGNOSIS — N390 Urinary tract infection, site not specified: Secondary | ICD-10-CM | POA: Diagnosis not present

## 2024-01-25 DIAGNOSIS — R300 Dysuria: Secondary | ICD-10-CM | POA: Diagnosis not present

## 2024-02-11 DIAGNOSIS — N368 Other specified disorders of urethra: Secondary | ICD-10-CM | POA: Diagnosis not present

## 2024-02-22 DIAGNOSIS — R7303 Prediabetes: Secondary | ICD-10-CM | POA: Diagnosis not present

## 2024-02-22 DIAGNOSIS — G8929 Other chronic pain: Secondary | ICD-10-CM | POA: Diagnosis not present

## 2024-02-22 DIAGNOSIS — E063 Autoimmune thyroiditis: Secondary | ICD-10-CM | POA: Diagnosis not present

## 2024-02-22 DIAGNOSIS — G43009 Migraine without aura, not intractable, without status migrainosus: Secondary | ICD-10-CM | POA: Diagnosis not present

## 2024-02-22 DIAGNOSIS — N951 Menopausal and female climacteric states: Secondary | ICD-10-CM | POA: Diagnosis not present

## 2024-02-22 DIAGNOSIS — Z23 Encounter for immunization: Secondary | ICD-10-CM | POA: Diagnosis not present

## 2024-02-22 DIAGNOSIS — Z6825 Body mass index (BMI) 25.0-25.9, adult: Secondary | ICD-10-CM | POA: Diagnosis not present

## 2024-02-22 DIAGNOSIS — F330 Major depressive disorder, recurrent, mild: Secondary | ICD-10-CM | POA: Diagnosis not present

## 2024-02-22 DIAGNOSIS — R102 Pelvic and perineal pain unspecified side: Secondary | ICD-10-CM | POA: Diagnosis not present

## 2024-03-29 DIAGNOSIS — S20219A Contusion of unspecified front wall of thorax, initial encounter: Secondary | ICD-10-CM | POA: Diagnosis not present

## 2024-03-29 DIAGNOSIS — M79603 Pain in arm, unspecified: Secondary | ICD-10-CM | POA: Diagnosis not present

## 2024-03-29 DIAGNOSIS — M79645 Pain in left finger(s): Secondary | ICD-10-CM | POA: Diagnosis not present

## 2024-03-29 DIAGNOSIS — S60032A Contusion of left middle finger without damage to nail, initial encounter: Secondary | ICD-10-CM | POA: Diagnosis not present

## 2024-03-29 DIAGNOSIS — R58 Hemorrhage, not elsewhere classified: Secondary | ICD-10-CM | POA: Diagnosis not present

## 2024-03-29 DIAGNOSIS — I10 Essential (primary) hypertension: Secondary | ICD-10-CM | POA: Diagnosis not present

## 2024-04-05 DIAGNOSIS — K6289 Other specified diseases of anus and rectum: Secondary | ICD-10-CM | POA: Diagnosis not present

## 2024-04-12 DIAGNOSIS — G588 Other specified mononeuropathies: Secondary | ICD-10-CM | POA: Diagnosis not present
